# Patient Record
Sex: Female | Born: 1965
Health system: Southern US, Community
[De-identification: ages and names within clinical notes are randomized; demographics above are authoritative.]

## PROBLEM LIST (undated history)

## (undated) DIAGNOSIS — C9210 Chronic myeloid leukemia, BCR/ABL-positive, not having achieved remission: Secondary | ICD-10-CM

## (undated) DIAGNOSIS — E7800 Pure hypercholesterolemia, unspecified: Secondary | ICD-10-CM

## (undated) HISTORY — PX: ABDOMINAL SURGERY: SHX537

## (undated) HISTORY — PX: LAPAROSCOPY: SHX197

---

## 2019-10-02 ENCOUNTER — Ambulatory Visit (HOSPITAL_COMMUNITY)
Admission: EM | Admit: 2019-10-02 | Discharge: 2019-10-02 | Disposition: A | Discharge status: Home / Self Care | Payer: HRSA Program | Attending: Family Medicine | Admitting: Family Medicine

## 2019-10-02 ENCOUNTER — Other Ambulatory Visit: Payer: Self-pay

## 2019-10-02 ENCOUNTER — Encounter (HOSPITAL_COMMUNITY): Payer: Self-pay | Admitting: Emergency Medicine

## 2019-10-02 DIAGNOSIS — B349 Viral infection, unspecified: Secondary | ICD-10-CM

## 2019-10-02 DIAGNOSIS — R197 Diarrhea, unspecified: Secondary | ICD-10-CM | POA: Diagnosis not present

## 2019-10-02 DIAGNOSIS — R110 Nausea: Secondary | ICD-10-CM | POA: Insufficient documentation

## 2019-10-02 DIAGNOSIS — E785 Hyperlipidemia, unspecified: Secondary | ICD-10-CM | POA: Insufficient documentation

## 2019-10-02 DIAGNOSIS — Z79899 Other long term (current) drug therapy: Secondary | ICD-10-CM | POA: Insufficient documentation

## 2019-10-02 DIAGNOSIS — Z881 Allergy status to other antibiotic agents status: Secondary | ICD-10-CM | POA: Diagnosis not present

## 2019-10-02 DIAGNOSIS — Z885 Allergy status to narcotic agent status: Secondary | ICD-10-CM | POA: Insufficient documentation

## 2019-10-02 DIAGNOSIS — Z20822 Contact with and (suspected) exposure to covid-19: Secondary | ICD-10-CM | POA: Insufficient documentation

## 2019-10-02 DIAGNOSIS — Z880 Allergy status to penicillin: Secondary | ICD-10-CM | POA: Diagnosis not present

## 2019-10-02 DIAGNOSIS — E7800 Pure hypercholesterolemia, unspecified: Secondary | ICD-10-CM | POA: Diagnosis not present

## 2019-10-02 HISTORY — DX: Pure hypercholesterolemia, unspecified: E78.00

## 2019-10-02 HISTORY — DX: Chronic myeloid leukemia, BCR/ABL-positive, not having achieved remission: C92.10

## 2019-10-02 LAB — POCT URINALYSIS DIP (DEVICE)
Glucose, UA: NEGATIVE mg/dL
Ketones, ur: NEGATIVE mg/dL
Leukocytes,Ua: NEGATIVE
Nitrite: NEGATIVE
Protein, ur: NEGATIVE mg/dL
Specific Gravity, Urine: 1.02 (ref 1.005–1.030)
Urobilinogen, UA: 0.2 mg/dL (ref 0.0–1.0)
pH: 5.5 (ref 5.0–8.0)

## 2019-10-02 LAB — POC SARS CORONAVIRUS 2 AG -  ED: SARS Coronavirus 2 Ag: NEGATIVE

## 2019-10-02 LAB — POC SARS CORONAVIRUS 2 AG: SARS Coronavirus 2 Ag: NEGATIVE

## 2019-10-02 MED ORDER — DULOXETINE HCL 30 MG PO CPEP: 30.0000 mg | ORAL_CAPSULE | Freq: Every day | ORAL | 0 refills | Status: AC

## 2019-10-02 MED ORDER — ACETAMINOPHEN 325 MG PO TABS
975.0000 mg | ORAL_TABLET | Freq: Once | ORAL | Status: AC
  Administered 2019-10-02: 975 mg via ORAL

## 2019-10-02 MED ORDER — ONDANSETRON 4 MG PO TBDP: 4.0000 mg | ORAL_TABLET | Freq: Three times a day (TID) | ORAL | 0 refills | Status: AC | PRN

## 2019-10-02 MED ORDER — ACETAMINOPHEN 325 MG PO TABS
ORAL_TABLET | ORAL | Status: AC
  Filled 2019-10-02: qty 3

## 2019-10-02 MED ORDER — ONDANSETRON 4 MG PO TBDP
ORAL_TABLET | ORAL | Status: AC
  Filled 2019-10-02: qty 1

## 2019-10-02 MED ORDER — ONDANSETRON 4 MG PO TBDP
4.0000 mg | ORAL_TABLET | Freq: Once | ORAL | Status: AC
  Administered 2019-10-02: 4 mg via ORAL

## 2019-10-02 NOTE — Discharge Instructions (Signed)
Rapid negative, PCR pending, Monitor MyChart for results  For nausea: Zofran prescribed. Begin with every 8 hours, than as you are able to hold food down, take it as needed. Start with clear liquids, then move to plain foods like bananas, rice, applesauce, toast, broth, grits, oatmeal. As those food settle okay you may transition to your normal foods. Avoid spicy and greasy foods as much as possible.  For Diarrhea: This is your body's natural way of getting rid of a virus. You may try taking imodium to decrease amount of stools a day, but we do not want you to stop your diarrhea.   Preventing dehydration is key! You need to replace the fluid your body is expelling. Drink plenty of fluids, may use Pedialyte or sports drinks.   Please return if you are experiencing blood in your vomit or stool or experiencing dizziness, lightheadedness, extreme fatigue, increased abdominal pain.

## 2019-10-02 NOTE — ED Triage Notes (Signed)
Pt states she traveled on a place here yesterday from [place], states this morning she started having severe diarrhea, nausea, headaches, body aches, leg pain, fever.

## 2019-10-02 NOTE — ED Provider Notes (Signed)
[facility]    CSN: [id number] Arrival date & time: 10/02/19  1908      History   Chief Complaint Chief Complaint  Patient presents with  . Diarrhea  . Generalized Body Aches  . Fever    HPI Nancy Jimenez is a 54 y.o. female history of CML, hyperlipidemia, prior Nissen fundal plication, presenting today for evaluation of fevers, diarrhea.  Patient states that this morning she began to have significant watery diarrhea.  Notes that she is gone to the bathroom approximately 20 times today.  She had some abdominal discomfort earlier today, but no persistent abdominal pain, reports generalized cramping which comes and goes with bowels.  She has had associated nausea, but denies vomiting.  Is tolerating oral intake and has been trying to sip on ginger ale.  She has associated headaches, body aches and lower back as well as bilateral thighs.  Fevers noted today as well.  Denies any URI symptoms of congestion, rhinorrhea or cough.  This evening has started to have a slight irritation in her throat.  She did recently travel from [place] and moved to [place] approximately 3 weeks ago.  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  She denies any known Covid exposure.  She notes that her last blood work was within normal limits and she is not currently on any active treatment for her CML.  She attempted to use some Pepto-Bismol earlier today.  Since noted a black tongue.  Has felt slightly weak as well as with a dry mouth.  Reports normal oral intake yesterday.  Reports oncology treatment from [facility] oncology through [place] and [facility].  Attempted to obtain records through care everywhere without success.  HPI  Past Medical History:  Diagnosis Date  . CML (chronic myelocytic leukemia) ([place])   . High cholesterol     There are no problems to display for this patient.   Past Surgical History:  Procedure  Laterality Date  . ABDOMINAL SURGERY      OB History   No obstetric history on file.      Home Medications    Prior to Admission medications   Medication Sig Start Date End Date Taking? Authorizing Provider  atorvastatin (LIPITOR) 10 MG tablet Take 10 mg by mouth daily.   Yes [provider]  buPROPion (WELLBUTRIN SR) 200 MG 12 hr tablet Take 200 mg by mouth 2 (two) times daily.   Yes [provider]  valACYclovir (VALTREX) 500 MG tablet Take 500 mg by mouth 2 (two) times daily.   Yes [provider]  DULoxetine (CYMBALTA) 30 MG capsule Take 1 capsule (30 mg total) by mouth daily. 10/02/19   Satonya Lux C, PA-C  ondansetron (ZOFRAN ODT) 4 MG disintegrating tablet Take 1 tablet (4 mg total) by mouth every 8 (eight) hours as needed for nausea or vomiting. 10/02/19   Daylynn Stumpp, Elesa Hacker, PA-C    Family History Family History  Problem Relation Age of Onset  . Hypertension Mother   . Hyperlipidemia Mother   . Hypertension Father   . Hyperlipidemia Father     Social History Social History   Tobacco Use  . Smoking status: Never Smoker  Substance Use Topics  . Alcohol use: Yes  . Drug use: Never     Allergies   Amoxicillin, Percocet [oxycodone-acetaminophen], and Zithromax [azithromycin]   Review of Systems Review of Systems  Constitutional: Positive for fatigue and  fever. Negative for activity change, appetite change and chills.  HENT: Positive for sore throat. Negative for congestion, ear pain, rhinorrhea, sinus pressure and trouble swallowing.   Eyes: Negative for discharge and redness.  Respiratory: Negative for cough, chest tightness and shortness of breath.   Cardiovascular: Negative for chest pain.  Gastrointestinal: Positive for diarrhea and nausea. Negative for abdominal pain, blood in stool and vomiting.  Musculoskeletal: Negative for myalgias.  Skin: Negative for rash.  Neurological: Positive for weakness, light-headedness and  headaches. Negative for dizziness.     Physical Exam Triage Vital Signs ED Triage Vitals [10/02/19 1938]  Enc Vitals Group     BP 128/84     Pulse Rate (!) 137     Resp 18     Temp 100.1 F (37.8 C)     Temp src      SpO2 98 %     Weight      Height      Head Circumference      Peak Flow      Pain Score 6     Pain Loc      Pain Edu?      Excl. in [place]?    No data found.  Updated Vital Signs BP 128/84   Pulse (!) 118   Temp 100.1 F (37.8 C)   Resp 18   SpO2 98%   Visual Acuity Right Eye Distance:   Left Eye Distance:   Bilateral Distance:    Right Eye Near:   Left Eye Near:    Bilateral Near:     Physical Exam Vitals and nursing note reviewed.  Constitutional:      General: She is not in acute distress.    Appearance: She is well-developed.  HENT:     Head: Normocephalic and atraumatic.     Ears:     Comments: Bilateral ears without tenderness to palpation of external auricle, tragus and mastoid, EAC's without erythema or swelling, TM's with good bony landmarks and cone of light. Non erythematous.     Mouth/Throat:     Comments: Oral mucosa pink and moist, no tonsillar enlargement or exudate. Posterior pharynx patent and nonerythematous, no uvula deviation or swelling. Normal phonation.  Tongue with hyperpigmented appearing papilla centrally and laterally Eyes:     Conjunctiva/sclera: Conjunctivae normal.  Cardiovascular:     Rate and Rhythm: Regular rhythm. Tachycardia present.     Heart sounds: No murmur.  Pulmonary:     Effort: Pulmonary effort is normal. No respiratory distress.     Breath sounds: Normal breath sounds.     Comments: Breathing comfortably at rest, CTABL, no wheezing, rales or other adventitious sounds auscultated Abdominal:     Palpations: Abdomen is soft.     Tenderness: There is abdominal tenderness.     Comments: Soft, nondistended, multiple well-healed laparoscopic surgical scars to abdomen, tenderness to palpation to right  mid abdomen, negative Murphy's, negative McBurney's, mild epigastric tenderness, nontender to left upper and lower quadrant  Musculoskeletal:     Cervical back: Neck supple.  Skin:    General: Skin is warm and dry.  Neurological:     Mental Status: She is alert.      UC Treatments / Results  Labs (all labs ordered are listed, but only abnormal results are displayed) Labs Reviewed  POCT URINALYSIS DIP (DEVICE) - Abnormal; Notable for the following components:      Result Value   Bilirubin Urine SMALL (*)    Hgb urine  dipstick TRACE (*)    All other components within normal limits  NOVEL CORONAVIRUS, NAA (HOSP ORDER, SEND-OUT TO REF LAB; TAT 18-24 HRS)  POC SARS CORONAVIRUS 2 AG -  ED  POC SARS CORONAVIRUS 2 AG    EKG   Radiology No results found.  Procedures Procedures (including critical care time)  Medications Ordered in UC Medications  acetaminophen (TYLENOL) tablet 975 mg (975 mg Oral Given 10/02/19 2018)  ondansetron (ZOFRAN-ODT) disintegrating tablet 4 mg (4 mg Oral Given 10/02/19 2018)    Initial Impression / Assessment and Plan / UC Course  I have reviewed the triage vital signs and the nursing notes.  Pertinent labs & imaging results that were available during my care of the patient were reviewed by me and considered in my medical decision making (see chart for details).     Patient with fever and tachycardia, 1 day of GI symptoms, abdominal exam negative for peritoneal signs, do not suspect abdominal emergency at this time, but discussed with patient to continue to monitor discomfort in abdomen and if symptoms progressing or worsening to follow-up in emergency room.  Covid point-of-care negative, PCR pending.  Tolerating oral intake at this time, heart rate improved with treatment of fever to 118 after 20 minutes.  Will recommend Imodium to slow stools as well as focus on oral rehydration with Pedialyte and Gatorade, slowly transition diet.  Tylenol and ibuprofen  for fevers.  Given history of CML, recommending patient to follow-up if symptoms progressing or worsening within the next 24 to 48 hours as may need blood work and fluids if failing oral rehydration.  Discussed strict return precautions. Patient verbalized understanding and is agreeable with plan.  Final Clinical Impressions(s) / UC Diagnoses   Final diagnoses:  Diarrhea, unspecified type  Viral illness     Discharge Instructions     Rapid negative, PCR pending, Monitor MyChart for results  For nausea: Zofran prescribed. Begin with every 8 hours, than as you are able to hold food down, take it as needed. Start with clear liquids, then move to plain foods like bananas, rice, applesauce, toast, broth, grits, oatmeal. As those food settle okay you may transition to your normal foods. Avoid spicy and greasy foods as much as possible.  For Diarrhea: This is your body's natural way of getting rid of a virus. You may try taking imodium to decrease amount of stools a day, but we do not want you to stop your diarrhea.   Preventing dehydration is key! You need to replace the fluid your body is expelling. Drink plenty of fluids, may use Pedialyte or sports drinks.   Please return if you are experiencing blood in your vomit or stool or experiencing dizziness, lightheadedness, extreme fatigue, increased abdominal pain.    ED Prescriptions    Medication Sig Dispense Auth. Provider   DULoxetine (CYMBALTA) 30 MG capsule Take 1 capsule (30 mg total) by mouth daily. 60 capsule Irvin Lizama C, PA-C   ondansetron (ZOFRAN ODT) 4 MG disintegrating tablet Take 1 tablet (4 mg total) by mouth every 8 (eight) hours as needed for nausea or vomiting. 20 tablet Tawnya Pujol, [place] C, PA-C     PDMP not reviewed this encounter.   Janith Lima, [place] 10/02/19 2052

## 2019-10-04 LAB — NOVEL CORONAVIRUS, NAA (HOSP ORDER, SEND-OUT TO REF LAB; TAT 18-24 HRS): SARS-CoV-2, NAA: NOT DETECTED

## 2019-10-16 ENCOUNTER — Ambulatory Visit: Payer: Self-pay | Attending: Internal Medicine

## 2019-10-16 DIAGNOSIS — Z23 Encounter for immunization: Secondary | ICD-10-CM | POA: Insufficient documentation

## 2019-10-16 NOTE — Progress Notes (Signed)
   Covid-19 Vaccination Clinic  Name:  Nancy Jimenez    MRN: [id number] DOB: April 24, 1966  10/16/2019  [name] was observed post Covid-19 immunization for 15 minutes without incidence. She was provided with Vaccine Information Sheet and instruction to access the V-Safe system.   [name] was instructed to call 911 with any severe reactions post vaccine: Marland Kitchen Difficulty breathing  . Swelling of your face and throat  . A fast heartbeat  . A bad rash all over your body  . Dizziness and weakness    Immunizations Administered    Name Date Dose VIS Date Route   Pfizer COVID-19 Vaccine 10/16/2019  6:39 PM 0.3 mL 08/21/2019 Intramuscular   Manufacturer: [place]   Lot: [id number]   [place]: [id number]

## 2019-11-10 ENCOUNTER — Ambulatory Visit: Payer: Self-pay | Attending: Internal Medicine

## 2019-11-10 ENCOUNTER — Ambulatory Visit: Payer: Self-pay

## 2019-11-10 DIAGNOSIS — Z23 Encounter for immunization: Secondary | ICD-10-CM | POA: Insufficient documentation

## 2019-11-10 NOTE — Progress Notes (Signed)
   Covid-19 Vaccination Clinic  Name:  Nancy Jimenez    MRN: [id number] DOB: September 29, 1965  11/10/2019  [name] was observed post Covid-19 immunization for 15 minutes without incident. She was provided with Vaccine Information Sheet and instruction to access the V-Safe system.   [name] was instructed to call 911 with any severe reactions post vaccine: Marland Kitchen Difficulty breathing  . Swelling of face and throat  . A fast heartbeat  . A bad rash all over body  . Dizziness and weakness   Immunizations Administered    Name Date Dose VIS Date Route   Pfizer COVID-19 Vaccine 11/10/2019 12:28 PM 0.3 mL 08/21/2019 Intramuscular   Manufacturer: [place]   Lot: [id number]   [place]: [id number]

## 2019-12-10 MED FILL — BUPROPION HCL SR 200 MG TAB: 200 | 90 days supply | Qty: 90 | Fill #0

## 2019-12-10 MED FILL — ATORVASTATIN 10 MG TABLET: 10 | 90 days supply | Qty: 90 | Fill #0

## 2019-12-10 MED FILL — VALACYCLOVIR HCL 500 MG TAB: 500 | 90 days supply | Qty: 180 | Fill #0

## 2019-12-11 MED FILL — DULOXETINE HCL 60 MG CPEP: 60 | 90 days supply | Qty: 90 | Fill #0

## 2020-01-26 DIAGNOSIS — C9210 Chronic myeloid leukemia, BCR/ABL-positive, not having achieved remission: Secondary | ICD-10-CM | POA: Diagnosis not present

## 2020-01-28 DIAGNOSIS — Z01411 Encounter for gynecological examination (general) (routine) with abnormal findings: Secondary | ICD-10-CM | POA: Diagnosis not present

## 2020-01-28 DIAGNOSIS — Z6835 Body mass index (BMI) 35.0-35.9, adult: Secondary | ICD-10-CM | POA: Diagnosis not present

## 2020-01-28 DIAGNOSIS — Z1231 Encounter for screening mammogram for malignant neoplasm of breast: Secondary | ICD-10-CM | POA: Diagnosis not present

## 2020-01-28 DIAGNOSIS — N952 Postmenopausal atrophic vaginitis: Secondary | ICD-10-CM | POA: Diagnosis not present

## 2020-01-28 DIAGNOSIS — Z1239 Encounter for other screening for malignant neoplasm of breast: Secondary | ICD-10-CM | POA: Diagnosis not present

## 2020-01-28 DIAGNOSIS — R3915 Urgency of urination: Secondary | ICD-10-CM | POA: Diagnosis not present

## 2020-01-28 DIAGNOSIS — Z124 Encounter for screening for malignant neoplasm of cervix: Secondary | ICD-10-CM | POA: Diagnosis not present

## 2020-01-28 MED FILL — ESTRADIOL 10 MCG TABS: 10 | 18 days supply | Qty: 18 | Fill #0

## 2020-02-05 ENCOUNTER — Other Ambulatory Visit (HOSPITAL_COMMUNITY): Payer: Self-pay | Admitting: Student

## 2020-02-05 DIAGNOSIS — C9210 Chronic myeloid leukemia, BCR/ABL-positive, not having achieved remission: Secondary | ICD-10-CM | POA: Diagnosis not present

## 2020-02-05 MED FILL — ONDANSETRON ODT 8 MG TABLET: 8 | 10 days supply | Qty: 30 | Fill #0

## 2020-02-10 DIAGNOSIS — C9210 Chronic myeloid leukemia, BCR/ABL-positive, not having achieved remission: Secondary | ICD-10-CM | POA: Diagnosis not present

## 2020-02-25 MED FILL — SPRYCEL 20 MG TABLET: 20 | 30 days supply | Qty: 30 | Fill #0

## 2020-02-26 MED FILL — ONDANSETRON ODT 8 MG TABLET: 8 | 10 days supply | Qty: 30 | Fill #0

## 2020-03-09 DIAGNOSIS — C9210 Chronic myeloid leukemia, BCR/ABL-positive, not having achieved remission: Secondary | ICD-10-CM | POA: Diagnosis not present

## 2020-03-10 MED FILL — DULOXETINE HCL 60 MG CPEP: 60 | 90 days supply | Qty: 90 | Fill #1

## 2020-03-11 DIAGNOSIS — C9210 Chronic myeloid leukemia, BCR/ABL-positive, not having achieved remission: Secondary | ICD-10-CM | POA: Diagnosis not present

## 2020-03-15 DIAGNOSIS — C9210 Chronic myeloid leukemia, BCR/ABL-positive, not having achieved remission: Secondary | ICD-10-CM | POA: Diagnosis not present

## 2020-03-21 MED FILL — SPRYCEL 20 MG TABLET: 20 | 30 days supply | Qty: 30 | Fill #1

## 2020-03-25 DIAGNOSIS — M545 Low back pain: Secondary | ICD-10-CM | POA: Diagnosis not present

## 2020-03-25 DIAGNOSIS — Z79899 Other long term (current) drug therapy: Secondary | ICD-10-CM | POA: Diagnosis not present

## 2020-03-25 DIAGNOSIS — C9210 Chronic myeloid leukemia, BCR/ABL-positive, not having achieved remission: Secondary | ICD-10-CM | POA: Diagnosis not present

## 2020-03-25 DIAGNOSIS — Z9889 Other specified postprocedural states: Secondary | ICD-10-CM | POA: Diagnosis not present

## 2020-03-25 DIAGNOSIS — M5441 Lumbago with sciatica, right side: Secondary | ICD-10-CM | POA: Diagnosis not present

## 2020-03-25 DIAGNOSIS — Z9221 Personal history of antineoplastic chemotherapy: Secondary | ICD-10-CM | POA: Diagnosis not present

## 2020-03-25 DIAGNOSIS — G43909 Migraine, unspecified, not intractable, without status migrainosus: Secondary | ICD-10-CM | POA: Diagnosis not present

## 2020-03-29 DIAGNOSIS — C9210 Chronic myeloid leukemia, BCR/ABL-positive, not having achieved remission: Secondary | ICD-10-CM | POA: Diagnosis not present

## 2020-04-15 MED FILL — BUPROPION HCL SR 200 MG TAB: 200 | 90 days supply | Qty: 90 | Fill #1

## 2020-04-19 MED FILL — SPRYCEL 20 MG TABLET: 20 | 30 days supply | Qty: 30 | Fill #2

## 2020-05-04 DIAGNOSIS — C9210 Chronic myeloid leukemia, BCR/ABL-positive, not having achieved remission: Secondary | ICD-10-CM | POA: Diagnosis not present

## 2020-05-08 ENCOUNTER — Other Ambulatory Visit: Payer: Self-pay

## 2020-05-08 ENCOUNTER — Emergency Department
Admission: EM | Admit: 2020-05-08 | Discharge: 2020-05-08 | Disposition: A | Discharge status: Home / Self Care | Payer: Self-pay | Source: Home / Self Care | Attending: Emergency Medicine | Admitting: Emergency Medicine

## 2020-05-08 DIAGNOSIS — W57XXXA Bitten or stung by nonvenomous insect and other nonvenomous arthropods, initial encounter: Secondary | ICD-10-CM

## 2020-05-08 DIAGNOSIS — L089 Local infection of the skin and subcutaneous tissue, unspecified: Secondary | ICD-10-CM

## 2020-05-08 DIAGNOSIS — S80861A Insect bite (nonvenomous), right lower leg, initial encounter: Secondary | ICD-10-CM

## 2020-05-08 MED ORDER — PREDNISONE 10 MG (21) PO TBPK: ORAL_TABLET | ORAL | 0 refills | Status: DC

## 2020-05-08 MED ORDER — DOXYCYCLINE HYCLATE 100 MG PO CAPS: 100.0000 mg | ORAL_CAPSULE | Freq: Two times a day (BID) | ORAL | 0 refills | Status: DC

## 2020-05-08 NOTE — ED Provider Notes (Signed)
[name]    CSN: [id number] Arrival date & time: 05/08/20  8341 New patient, presents to urgent care on Sunday, 05/08/2020     History   Chief Complaint No chief complaint on file. Chief complaint is worsening bee sting right leg, occurred 2 days ago.  HPI Nancy Jimenez is a 54 y.o. female.   HPI Stung by bee 2 days ago on right leg.  Denies having any anaphylactic or shortness of breath or systemic symptoms right after it occurred. Over the past 2 days, site of bee sting right leg is redder, more swollen, more painful and pruritic.  No drainage or bleeding. Denies fever or chills or chest pain or shortness of breath or facial swelling or dysphagia or nausea or vomiting or abdominal pain.  Pertinent items noted in HPI and remainder of comprehensive ROS otherwise negative.  Past Medical History:  Diagnosis Date  . CML (chronic myelocytic leukemia) ([place])   . High cholesterol     There are no problems to display for this patient.   Past Surgical History:  Procedure Laterality Date  . ABDOMINAL SURGERY    . LAPAROSCOPY      OB History   No obstetric history on file.      Home Medications    Prior to Admission medications   Medication Sig Start Date End Date Taking? Authorizing Provider  atorvastatin (LIPITOR) 10 MG tablet Take 10 mg by mouth daily.   Yes [provider]  buPROPion (WELLBUTRIN SR) 200 MG 12 hr tablet Take 200 mg by mouth 2 (two) times daily.   Yes [provider]  dasatinib (SPRYCEL) 100 MG tablet [place] 100 mg tablet   Yes [provider]  DULoxetine (CYMBALTA) 30 MG capsule Take 1 capsule (30 mg total) by mouth daily. 10/02/19  Yes Wieters, Hallie C, PA-C  fluticasone (FLONASE) 50 MCG/ACT nasal spray fluticasone propionate 50 mcg/actuation nasal spray,suspension   Yes [provider]  ondansetron (ZOFRAN ODT) 4 MG disintegrating tablet Take 1 tablet (4 mg total) by mouth every 8 (eight) hours as  needed for nausea or vomiting. 10/02/19  Yes Wieters, Hallie C, PA-C  valACYclovir (VALTREX) 500 MG tablet Take 500 mg by mouth 2 (two) times daily.   Yes [provider]  doxycycline (VIBRAMYCIN) 100 MG capsule Take 1 capsule (100 mg total) by mouth 2 (two) times daily. 05/08/20   Jacqulyn Cane, MD  predniSONE (STERAPRED UNI-PAK 21 TAB) 10 MG (21) TBPK tablet Take tapering dosage over 6 days as directed 05/08/20   Jacqulyn Cane, MD    Family History Family History  Problem Relation Age of Onset  . Hypertension Mother   . Hyperlipidemia Mother   . Hypertension Father   . Hyperlipidemia Father     Social History Social History   Tobacco Use  . Smoking status: Never Smoker  . Smokeless tobacco: Never Used  Substance Use Topics  . Alcohol use: Yes    Comment: occ  . Drug use: Never     Allergies   Bee pollen, Codeine, Other, Amoxicillin, Percocet [oxycodone-acetaminophen], and Zithromax [azithromycin]   Review of Systems Review of Systems   Physical Exam Triage Vital Signs ED Triage Vitals  Enc Vitals Group     BP 05/08/20 0950 128/82     Pulse Rate 05/08/20 0950 94     Resp --      Temp 05/08/20 0950 99.2 F (37.3 C)     Temp Source 05/08/20 0950 Oral  SpO2 05/08/20 0950 98 %     Weight 05/08/20 0945 191 lb (86.6 kg)     Height 05/08/20 0945 5\' 2"  (1.575 m)     Head Circumference --      Peak Flow --      Pain Score 05/08/20 0945 6     Pain Loc --      Pain Edu? --      Excl. in [place]? --    No data found.  Updated Vital Signs BP 128/82 (BP Location: Right Arm)   Pulse 94   Temp 99.2 F (37.3 C) (Oral)   Ht 5\' 2"  (1.575 m)   Wt 86.6 kg   SpO2 98%   BMI 34.93 kg/m   Physical Exam Vitals reviewed.  Constitutional:      General: She is not in acute distress.  Uncomfortable from right leg bee sting, but no acute cardiorespiratory distress.  She can ambulate on her own.    Appearance: She is well-developed.  HENT:     Head: Normocephalic and  atraumatic.  No facial or lip swelling. Oropharynx: Clear.  No redness or edema.  Airway intact. Eyes:     General: No scleral icterus.    Pupils: Pupils are equal, round, and reactive to light.  Neck: Supple, nontender. Cardiovascular:     Rate and Rhythm: Normal rate and regular rhythm.  No murmur. Pulmonary:     Effort: Pulmonary effort is normal.  Lungs clear to auscultation bilaterally.  No wheezes rales or rhonchi. Abdominal:     General: There is no distension.  Musculoskeletal:   Right lower extremity: No hip tenderness or deformity. Right knee without any bony tenderness or deformity.  Full range of motion. Red swollen indurated warm, tender 3 x 3 cm area right lateral leg, near, but does not extend into the popliteal area.-No fluctuance or red streaks.  Neurovascular distally intact.  No bony tenderness. No signs of DVT.  No cords or calf edema or tenderness. Skin:    General: Skin is warm and dry.  Other than the above, there is no other rash.  No generalized rash. Neurological:     Mental Status: She is alert and oriented to person, place, and time.     Cranial Nerves: No cranial nerve deficit.  Psychiatric:        Behavior: Behavior normal.     UC Treatments / Results  Labs (all labs ordered are listed, but only abnormal results are displayed) Labs Reviewed - No data to display  EKG   Radiology No results found.  Procedures Procedures (including critical care time)  Medications Ordered in UC Medications - No data to display  Initial Impression / Assessment and Plan / UC Course  I have reviewed the triage vital signs and the nursing notes.  Pertinent labs & imaging results that were available during my care of the patient were reviewed by me and considered in my medical decision making (see chart for details).      Final Clinical Impressions(s) / UC Diagnoses   Final diagnoses:  Infected insect bite of right leg, initial encounter  Local allergic  reaction at the site of the bee sting but no generalized allergic reaction or systemic symptoms.  See detailed discharge instructions below. Advised to keep an EpiPen at home on hand in case she gets any generalized allergic reaction from bee sting in the future.   Discharge Instructions     You likely have skin soft tissue infection right  leg from your bee sting 2 days ago.  It is also a local allergic reaction, but not allergic reaction throughout your whole body. Of sent prescriptions to your drugstore for doxycycline, a strong antibiotic for infection, and prednisone Dosepak which should help the itch swelling and pain. May take Benadryl, 1 at bedtime for itch. Tylenol or ibuprofen if needed for pain. Try cool or cold compresses to help with the pain.  Keep right leg elevated. Seek medical care immediately if any severely worsening symptoms.   She voiced understanding and agreement with plans. ED Prescriptions    Medication Sig Dispense Auth. Provider   doxycycline (VIBRAMYCIN) 100 MG capsule Take 1 capsule (100 mg total) by mouth 2 (two) times daily. 20 capsule Jacqulyn Cane, MD   predniSONE (STERAPRED UNI-PAK 21 TAB) 10 MG (21) TBPK tablet Take tapering dosage over 6 days as directed 21 tablet Jacqulyn Cane, MD     PDMP not reviewed this encounter.   Jacqulyn Cane, MD 05/09/20 [phone]

## 2020-05-08 NOTE — ED Triage Notes (Signed)
Pt states that she was stung by a bee  x2 days ago on her right leg. Pt states that the leg is starting to swell and it is turn red. Pt states that she is fully vaccinated.

## 2020-05-08 NOTE — Discharge Instructions (Addendum)
You likely have skin soft tissue infection right leg from your bee sting 2 days ago.  It is also a local allergic reaction, but not allergic reaction throughout your whole body. Of sent prescriptions to your drugstore for doxycycline, a strong antibiotic for infection, and prednisone Dosepak which should help the itch swelling and pain. May take Benadryl, 1 at bedtime for itch. Tylenol or ibuprofen if needed for pain. Try cool or cold compresses to help with the pain.  Keep right leg elevated. Seek medical care immediately if any severely worsening symptoms.

## 2020-05-09 ENCOUNTER — Ambulatory Visit: Payer: Self-pay

## 2020-05-09 DIAGNOSIS — C9210 Chronic myeloid leukemia, BCR/ABL-positive, not having achieved remission: Secondary | ICD-10-CM | POA: Diagnosis not present

## 2020-05-17 DIAGNOSIS — R3915 Urgency of urination: Secondary | ICD-10-CM | POA: Diagnosis not present

## 2020-05-17 DIAGNOSIS — B373 Candidiasis of vulva and vagina: Secondary | ICD-10-CM | POA: Diagnosis not present

## 2020-05-17 DIAGNOSIS — N952 Postmenopausal atrophic vaginitis: Secondary | ICD-10-CM | POA: Diagnosis not present

## 2020-05-25 MED FILL — SPRYCEL 20 MG TABLET: 20 | 30 days supply | Qty: 30 | Fill #3

## 2020-06-13 DIAGNOSIS — G4730 Sleep apnea, unspecified: Secondary | ICD-10-CM | POA: Diagnosis not present

## 2020-06-13 DIAGNOSIS — Z23 Encounter for immunization: Secondary | ICD-10-CM | POA: Diagnosis not present

## 2020-06-13 DIAGNOSIS — E7800 Pure hypercholesterolemia, unspecified: Secondary | ICD-10-CM | POA: Diagnosis not present

## 2020-06-13 DIAGNOSIS — F330 Major depressive disorder, recurrent, mild: Secondary | ICD-10-CM | POA: Diagnosis not present

## 2020-06-13 DIAGNOSIS — G43009 Migraine without aura, not intractable, without status migrainosus: Secondary | ICD-10-CM | POA: Diagnosis not present

## 2020-06-13 DIAGNOSIS — C9210 Chronic myeloid leukemia, BCR/ABL-positive, not having achieved remission: Secondary | ICD-10-CM | POA: Diagnosis not present

## 2020-06-16 ENCOUNTER — Other Ambulatory Visit: Payer: 59

## 2020-06-22 ENCOUNTER — Other Ambulatory Visit (HOSPITAL_COMMUNITY): Payer: Self-pay

## 2020-06-22 MED FILL — ATORVASTATIN CALCIUM 10 MG: 10 | 90 days supply | Qty: 90 | Fill #1

## 2020-06-22 MED FILL — VALACYCLOVIR HCL 500 MG TAB: 500 | 90 days supply | Qty: 90 | Fill #0

## 2020-06-22 MED FILL — DULoxetine HCL 30 MG CPEP: 30 | 90 days supply | Qty: 90 | Fill #0

## 2020-06-23 MED FILL — DULOXETINE HCL 60 MG CPEP: 60 | 30 days supply | Qty: 30 | Fill #0

## 2020-06-26 MED FILL — BUPROPION HCL SR 200 MG TAB: 200 | 90 days supply | Qty: 90 | Fill #0

## 2020-06-27 ENCOUNTER — Encounter (HOSPITAL_BASED_OUTPATIENT_CLINIC_OR_DEPARTMENT_OTHER): Payer: Self-pay

## 2020-06-27 DIAGNOSIS — R0683 Snoring: Secondary | ICD-10-CM

## 2020-06-27 DIAGNOSIS — G4710 Hypersomnia, unspecified: Secondary | ICD-10-CM

## 2020-06-27 DIAGNOSIS — G4733 Obstructive sleep apnea (adult) (pediatric): Secondary | ICD-10-CM

## 2020-06-27 MED FILL — SPRYCEL 20 MG TABLET: 20 | 30 days supply | Qty: 30 | Fill #4

## 2020-07-05 DIAGNOSIS — C9210 Chronic myeloid leukemia, BCR/ABL-positive, not having achieved remission: Secondary | ICD-10-CM | POA: Diagnosis not present

## 2020-07-05 DIAGNOSIS — Z79899 Other long term (current) drug therapy: Secondary | ICD-10-CM | POA: Diagnosis not present

## 2020-07-05 DIAGNOSIS — M545 Low back pain, unspecified: Secondary | ICD-10-CM | POA: Diagnosis not present

## 2020-07-05 DIAGNOSIS — G43909 Migraine, unspecified, not intractable, without status migrainosus: Secondary | ICD-10-CM | POA: Diagnosis not present

## 2020-07-06 DIAGNOSIS — C9210 Chronic myeloid leukemia, BCR/ABL-positive, not having achieved remission: Secondary | ICD-10-CM | POA: Diagnosis not present

## 2020-07-09 ENCOUNTER — Ambulatory Visit: Payer: 59

## 2020-07-23 ENCOUNTER — Ambulatory Visit: Payer: 59 | Attending: Internal Medicine

## 2020-07-23 DIAGNOSIS — Z23 Encounter for immunization: Secondary | ICD-10-CM

## 2020-07-23 NOTE — Progress Notes (Signed)
   Covid-19 Vaccination Clinic  Name:  Nancy Jimenez    MRN: [id number] DOB: [birth year]  07/23/2020  [name] was observed post Covid-19 immunization for 15 minutes without incident. She was provided with Vaccine Information Sheet and instruction to access the V-Safe system.   [name] was instructed to call 911 with any severe reactions post vaccine: Marland Kitchen Difficulty breathing  . Swelling of face and throat  . A fast heartbeat  . A bad rash all over body  . Dizziness and weakness   Immunizations Administered    Name Date Dose VIS Date Route   Pfizer COVID-19 Vaccine 07/23/2020  1:17 PM 0.3 mL 06/29/2020 Intramuscular   Manufacturer: [place]   Lot: [id number]   [place]: [id number]

## 2020-07-25 MED FILL — SPRYCEL 20 MG TABLET: 20 | 30 days supply | Qty: 30 | Fill #5

## 2020-07-27 ENCOUNTER — Ambulatory Visit (HOSPITAL_BASED_OUTPATIENT_CLINIC_OR_DEPARTMENT_OTHER): Payer: 59 | Attending: Internal Medicine | Admitting: Internal Medicine

## 2020-07-27 ENCOUNTER — Other Ambulatory Visit: Payer: Self-pay

## 2020-07-27 DIAGNOSIS — G4710 Hypersomnia, unspecified: Secondary | ICD-10-CM

## 2020-07-27 DIAGNOSIS — R0683 Snoring: Secondary | ICD-10-CM

## 2020-07-27 DIAGNOSIS — R400 Somnolence: Secondary | ICD-10-CM | POA: Diagnosis present

## 2020-07-27 DIAGNOSIS — G4733 Obstructive sleep apnea (adult) (pediatric): Secondary | ICD-10-CM | POA: Insufficient documentation

## 2020-08-01 ENCOUNTER — Ambulatory Visit: Payer: 59

## 2020-08-02 DIAGNOSIS — G4733 Obstructive sleep apnea (adult) (pediatric): Secondary | ICD-10-CM | POA: Diagnosis not present

## 2020-08-02 NOTE — Procedures (Signed)
    NAME: Nancy Jimenez DATE OF BIRTH:  May 11, 1966 MEDICAL RECORD NUMBER [id number]  LOCATION: [facility]  PHYSICIAN: Marius Ditch  DATE OF STUDY: 07/27/2020  SLEEP STUDY TYPE: Out of Center Sleep Test                REFERRING PHYSICIAN: Marius Ditch, MD  INDICATION FOR STUDY: prior h/o OSA; old records not available. Test done to re-establish diagnosis and obtain machine; excessive daytime sleepiness; witnessed apnea   EPWORTH SLEEPINESS SCORE:  NA HEIGHT: 5\' 2"  (157.5 cm)  WEIGHT: 192 lb (87.1 kg)    Body mass index is 35.12 kg/m.  NECK SIZE: 15 in.  Conclusions: 1. Mild to moderate obstructive sleep apnea (Respiratory Event Index (REI) 15/hr) - please note that REI approximates AHI but is not identical as a HST measures time of use and not time of sleep. 2. Severe desaturations (min O2 sat: 71%; time below 90%: 25 minutes [5% of monitored time])   Recommendations: 1. The patient should be treated with weight loss (if appropriate), an oral appliance, or CPAP. If CPAP is chosen, this could be started by auto-CPAP or by CPAP titration in the lab.  2. Please note that a HST may underestimate the degree of obstructive sleep apnea due to the lack of EEG data. Therefore, respiratory effort related arousals (RERAs) will not be measured. If a prominent part of the patient's sleep disordered breathing are RERAs, then the HST will understate the severity of the patient's obstructive sleep apnea.   This study is a Type III Home Sleep Apnea Test measuring respiratory effort, airflow, heart rate, and oxygen saturation.   Marius Ditch Sleep specialist, [place] [employer]  ELECTRONICALLY SIGNED ON:  08/02/2020, 8:27 AM [place] PH: [phone]   FX: [phone] [place]

## 2020-08-25 ENCOUNTER — Other Ambulatory Visit (HOSPITAL_COMMUNITY): Payer: Self-pay | Admitting: Hematology & Oncology

## 2020-08-25 MED FILL — SPRYCEL 20 MG TABLET: 20 | 30 days supply | Qty: 30 | Fill #0

## 2020-09-21 MED FILL — SPRYCEL 20 MG TABLET: 20 | 30 days supply | Qty: 30 | Fill #1

## 2020-10-04 DIAGNOSIS — G4700 Insomnia, unspecified: Secondary | ICD-10-CM | POA: Diagnosis not present

## 2020-10-04 DIAGNOSIS — R197 Diarrhea, unspecified: Secondary | ICD-10-CM | POA: Diagnosis not present

## 2020-10-04 DIAGNOSIS — G4730 Sleep apnea, unspecified: Secondary | ICD-10-CM | POA: Diagnosis not present

## 2020-10-04 DIAGNOSIS — Z79899 Other long term (current) drug therapy: Secondary | ICD-10-CM | POA: Diagnosis not present

## 2020-10-04 DIAGNOSIS — C9210 Chronic myeloid leukemia, BCR/ABL-positive, not having achieved remission: Secondary | ICD-10-CM | POA: Diagnosis not present

## 2020-10-04 DIAGNOSIS — G43909 Migraine, unspecified, not intractable, without status migrainosus: Secondary | ICD-10-CM | POA: Diagnosis not present

## 2020-10-06 ENCOUNTER — Other Ambulatory Visit (HOSPITAL_COMMUNITY): Payer: Self-pay | Admitting: Internal Medicine

## 2020-10-06 DIAGNOSIS — C9210 Chronic myeloid leukemia, BCR/ABL-positive, not having achieved remission: Secondary | ICD-10-CM | POA: Diagnosis not present

## 2020-10-06 DIAGNOSIS — E663 Overweight: Secondary | ICD-10-CM | POA: Diagnosis not present

## 2020-10-06 DIAGNOSIS — Z0000 Encounter for general adult medical examination without abnormal findings: Secondary | ICD-10-CM | POA: Diagnosis not present

## 2020-10-06 DIAGNOSIS — F330 Major depressive disorder, recurrent, mild: Secondary | ICD-10-CM | POA: Diagnosis not present

## 2020-10-06 DIAGNOSIS — E7800 Pure hypercholesterolemia, unspecified: Secondary | ICD-10-CM | POA: Diagnosis not present

## 2020-10-06 DIAGNOSIS — G4733 Obstructive sleep apnea (adult) (pediatric): Secondary | ICD-10-CM | POA: Diagnosis not present

## 2020-10-06 MED FILL — VALACYCLOVIR HCL 500 MG TAB: 500 | 90 days supply | Qty: 90 | Fill #0

## 2020-10-06 MED FILL — BUPROPION HCL SR 200 MG TAB: 200 | 90 days supply | Qty: 90 | Fill #0

## 2020-10-06 MED FILL — ATORVASTATIN CALCIUM 10 MG: 10 | 90 days supply | Qty: 90 | Fill #0

## 2020-10-06 MED FILL — DULoxetine HCL 30 MG CPEP: 30 | 90 days supply | Qty: 90 | Fill #0

## 2020-10-07 DIAGNOSIS — C9210 Chronic myeloid leukemia, BCR/ABL-positive, not having achieved remission: Secondary | ICD-10-CM | POA: Diagnosis not present

## 2020-11-03 MED FILL — SPRYCEL 20 MG TABLET: 20 | 30 days supply | Qty: 30 | Fill #2

## 2020-11-25 ENCOUNTER — Other Ambulatory Visit (HOSPITAL_COMMUNITY): Payer: Self-pay | Admitting: Dermatology

## 2020-11-25 DIAGNOSIS — L815 Leukoderma, not elsewhere classified: Secondary | ICD-10-CM | POA: Diagnosis not present

## 2020-11-25 DIAGNOSIS — L821 Other seborrheic keratosis: Secondary | ICD-10-CM | POA: Diagnosis not present

## 2020-11-25 DIAGNOSIS — L309 Dermatitis, unspecified: Secondary | ICD-10-CM | POA: Diagnosis not present

## 2020-11-25 DIAGNOSIS — R6889 Other general symptoms and signs: Secondary | ICD-10-CM | POA: Diagnosis not present

## 2020-11-25 DIAGNOSIS — L304 Erythema intertrigo: Secondary | ICD-10-CM | POA: Diagnosis not present

## 2020-11-30 MED FILL — SPRYCEL 20 MG TABLET: 20 | 30 days supply | Qty: 30 | Fill #3

## 2020-12-06 ENCOUNTER — Other Ambulatory Visit (HOSPITAL_COMMUNITY): Payer: Self-pay

## 2020-12-14 ENCOUNTER — Other Ambulatory Visit (HOSPITAL_COMMUNITY): Payer: Self-pay

## 2020-12-14 MED FILL — Desonide Foam 0.05%: CUTANEOUS | 30 days supply | Qty: 100 | Fill #0 | Status: CN

## 2020-12-16 ENCOUNTER — Other Ambulatory Visit (HOSPITAL_COMMUNITY): Payer: Self-pay

## 2020-12-19 ENCOUNTER — Other Ambulatory Visit (HOSPITAL_COMMUNITY): Payer: Self-pay

## 2020-12-19 MED ORDER — DESONIDE 0.05 % EX GEL
CUTANEOUS | 3 refills | Status: AC
  Filled 2020-12-19: qty 60, 14d supply, fill #0
  Filled 2020-12-22 – 2020-12-26 (×2): qty 60, 30d supply, fill #0

## 2020-12-22 ENCOUNTER — Other Ambulatory Visit (HOSPITAL_COMMUNITY): Payer: Self-pay

## 2020-12-24 ENCOUNTER — Other Ambulatory Visit (HOSPITAL_COMMUNITY): Payer: Self-pay

## 2020-12-24 MED ORDER — DESONIDE 0.05 % EX OINT
TOPICAL_OINTMENT | CUTANEOUS | 2 refills | Status: AC
  Filled 2020-12-24: qty 60, 20d supply, fill #0
  Filled 2020-12-26: qty 60, 30d supply, fill #0

## 2020-12-26 ENCOUNTER — Other Ambulatory Visit (HOSPITAL_COMMUNITY): Payer: Self-pay

## 2020-12-26 MED FILL — Dasatinib Tab 20 MG: ORAL | 30 days supply | Qty: 30 | Fill #0 | Status: AC

## 2020-12-28 ENCOUNTER — Other Ambulatory Visit (HOSPITAL_COMMUNITY): Payer: Self-pay

## 2020-12-29 ENCOUNTER — Other Ambulatory Visit (HOSPITAL_COMMUNITY): Payer: Self-pay

## 2020-12-30 ENCOUNTER — Other Ambulatory Visit (HOSPITAL_COMMUNITY): Payer: Self-pay

## 2020-12-30 MED ORDER — HYDROCORTISONE 2.5 % EX CREA
TOPICAL_CREAM | CUTANEOUS | 3 refills | Status: AC
  Filled 2020-12-30: qty 28, 14d supply, fill #0

## 2021-01-02 ENCOUNTER — Other Ambulatory Visit (HOSPITAL_COMMUNITY): Payer: Self-pay

## 2021-01-02 MED FILL — Atorvastatin Calcium Tab 10 MG (Base Equivalent): ORAL | 90 days supply | Qty: 90 | Fill #0 | Status: AC

## 2021-01-03 DIAGNOSIS — K521 Toxic gastroenteritis and colitis: Secondary | ICD-10-CM | POA: Diagnosis not present

## 2021-01-03 DIAGNOSIS — G43909 Migraine, unspecified, not intractable, without status migrainosus: Secondary | ICD-10-CM | POA: Diagnosis not present

## 2021-01-03 DIAGNOSIS — R5383 Other fatigue: Secondary | ICD-10-CM | POA: Diagnosis not present

## 2021-01-03 DIAGNOSIS — C9210 Chronic myeloid leukemia, BCR/ABL-positive, not having achieved remission: Secondary | ICD-10-CM | POA: Diagnosis not present

## 2021-01-05 DIAGNOSIS — C9210 Chronic myeloid leukemia, BCR/ABL-positive, not having achieved remission: Secondary | ICD-10-CM | POA: Diagnosis not present

## 2021-01-16 ENCOUNTER — Other Ambulatory Visit: Payer: Self-pay

## 2021-01-16 ENCOUNTER — Emergency Department (HOSPITAL_BASED_OUTPATIENT_CLINIC_OR_DEPARTMENT_OTHER): Payer: 59

## 2021-01-16 ENCOUNTER — Emergency Department (HOSPITAL_BASED_OUTPATIENT_CLINIC_OR_DEPARTMENT_OTHER)
Admission: EM | Admit: 2021-01-16 | Discharge: 2021-01-16 | Disposition: A | Discharge status: Home / Self Care | Payer: 59 | Attending: Emergency Medicine | Admitting: Emergency Medicine

## 2021-01-16 ENCOUNTER — Encounter (HOSPITAL_BASED_OUTPATIENT_CLINIC_OR_DEPARTMENT_OTHER): Payer: Self-pay | Admitting: Emergency Medicine

## 2021-01-16 DIAGNOSIS — U071 COVID-19: Secondary | ICD-10-CM | POA: Diagnosis not present

## 2021-01-16 DIAGNOSIS — R0602 Shortness of breath: Secondary | ICD-10-CM | POA: Diagnosis present

## 2021-01-16 DIAGNOSIS — R059 Cough, unspecified: Secondary | ICD-10-CM | POA: Diagnosis not present

## 2021-01-16 DIAGNOSIS — R519 Headache, unspecified: Secondary | ICD-10-CM | POA: Diagnosis not present

## 2021-01-16 MED ORDER — BENZONATATE 100 MG PO CAPS: 100.0000 mg | ORAL_CAPSULE | Freq: Three times a day (TID) | ORAL | 0 refills | Status: DC | PRN

## 2021-01-16 NOTE — ED Provider Notes (Signed)
[facility] EMERGENCY DEPT Provider Note   CSN: [id number] Arrival date & time: 01/16/21  1752     History Chief Complaint  Patient presents with  . Shortness of Breath    Nancy Jimenez is a 55 y.o. female.  The history is provided by the patient and medical records.  Shortness of Breath  Nancy Jimenez is a 55 y.o. female who presents to the Emergency Department complaining of sob. She has a history of CML and takes sprycel 20 mg daily. On May 3 she started developing headache, sinus pressure and drainage with dry cough. Then later she developed body aches and low-grade fever. Yesterday her fever went up to 101.5. She also developed worsening cough and she now has chest pain with coughing as well sore throat. She finally tested positive for COVID-19 on a home test yesterday. Her son has been positive for several days.   Has been vaccinated and boosted for COVID 19.      Past Medical History:  Diagnosis Date  . CML (chronic myelocytic leukemia) ([place])   . High cholesterol     There are no problems to display for this patient.   Past Surgical History:  Procedure Laterality Date  . ABDOMINAL SURGERY    . LAPAROSCOPY       OB History   No obstetric history on file.     Family History  Problem Relation Age of Onset  . Hypertension Mother   . Hyperlipidemia Mother   . Hypertension Father   . Hyperlipidemia Father     Social History   Tobacco Use  . Smoking status: Never Smoker  . Smokeless tobacco: Never Used  Substance Use Topics  . Alcohol use: Yes    Comment: occ  . Drug use: Never    Home Medications Prior to Admission medications   Medication Sig Start Date End Date Taking? Authorizing Provider  benzonatate (TESSALON) 100 MG capsule Take 1 capsule (100 mg total) by mouth 3 (three) times daily as needed for cough. 01/16/21  Yes Quintella Reichert, MD  atorvastatin (LIPITOR) 10 MG tablet Take 10 mg by mouth daily.    [provider]   atorvastatin (LIPITOR) 10 MG tablet TAKE 1 TABLET BY MOUTH ONCE A DAY 10/06/20 10/06/21  Seward Carol, MD  buPROPion [facility] SR) 200 MG 12 hr tablet Take 200 mg by mouth 2 (two) times daily.    [provider]  buPROPion (WELLBUTRIN SR) 200 MG 12 hr tablet TAKE 1 TABLET BY MOUTH ONCE DAILY 10/06/20 10/06/21  Seward Carol, MD  buPROPion (WELLBUTRIN SR) 200 MG 12 hr tablet TAKE 1 TABLET BY MOUTH ONCE A DAY 06/22/20 06/22/21  Seward Carol, MD  dasatinib (SPRYCEL) 100 MG tablet Sprycel 100 mg tablet    [provider]  dasatinib (SPRYCEL) 20 MG tablet TAKE 1 TABLET (20 MG TOTAL) BY MOUTH DAILY. 08/25/20 08/25/21  Toy Care, PA-C  dasatinib (SPRYCEL) 20 MG tablet TAKE 1 TABLET (20 MG TOTAL) BY MOUTH DAILY. 02/05/20 02/04/21  Jacqualine Mau, MD  desonide (DESONATE) 0.05 % gel Apply to face and neck 1-2 times daily as needed for itching and irritation. 12/19/20     desonide (DESOWEN) 0.05 % ointment Apply topically 2 times daily. Apply to face and neck 1 to 2 times daily as needed for itching and irritation. 12/24/20     desonide (VERDESO) 0.05 % foam APPLT TO FACE AND NECK 1-2 TIMES A DAY AS NEEDED FOR ITCHY, IRRITATED SKIN 11/25/20 11/25/21  Kimberlee Nearing  L, MD  doxycycline (VIBRAMYCIN) 100 MG capsule Take 1 capsule (100 mg total) by mouth 2 (two) times daily. 05/08/20   Jacqulyn Cane, MD  DULoxetine (CYMBALTA) 30 MG capsule Take 1 capsule (30 mg total) by mouth daily. 10/02/19   Wieters, Hallie C, PA-C  DULoxetine (CYMBALTA) 30 MG capsule TAKE 1 CAPSULE BY MOUTH ONCE A DAY 10/06/20 10/06/21  Seward Carol, MD  DULoxetine (CYMBALTA) 60 MG capsule TAKE 1 CAPSULE BY MOUTH ONCE DAILY 06/22/20 06/22/21    fluticasone (FLONASE) 50 MCG/ACT nasal spray fluticasone propionate 50 mcg/actuation nasal spray,suspension    [provider]  hydrocortisone 2.5 % cream Apply to affected areas on face 1-2 times daily as needed 12/30/20     ondansetron (ZOFRAN ODT) 4 MG disintegrating  tablet Take 1 tablet (4 mg total) by mouth every 8 (eight) hours as needed for nausea or vomiting. 10/02/19   Wieters, Hallie C, PA-C  predniSONE (STERAPRED UNI-PAK 21 TAB) 10 MG (21) TBPK tablet Take tapering dosage over 6 days as directed 05/08/20   Jacqulyn Cane, MD  valACYclovir (VALTREX) 500 MG tablet Take 500 mg by mouth 2 (two) times daily.    [provider]  valACYclovir (VALTREX) 500 MG tablet TAKE 1 TABLET BY MOUTH ONCE A DAY 10/06/20 10/06/21  Seward Carol, MD  valACYclovir (VALTREX) 500 MG tablet TAKE 1 TABLET BY MOUTH ONCE A DAY 06/22/20 06/22/21  Seward Carol, MD    Allergies    Bee pollen, Codeine, Other, Amoxicillin, Percocet [oxycodone-acetaminophen], and Zithromax [azithromycin]  Review of Systems   Review of Systems  Respiratory: Positive for shortness of breath.   All other systems reviewed and are negative.   Physical Exam Updated Vital Signs BP (!) 152/83 (BP Location: Right Arm)   Pulse 100   Temp 98.6 F (37 C) (Oral)   Resp 18   Ht 5\' 2"  (1.575 m)   Wt 87.1 kg   SpO2 96%   BMI 35.12 kg/m   Physical Exam Vitals and nursing note reviewed.  Constitutional:      Appearance: She is well-developed.  HENT:     Head: Normocephalic and atraumatic.     Mouth/Throat:     Comments: Mild erythema in posterior OP Cardiovascular:     Rate and Rhythm: Normal rate and regular rhythm.     Heart sounds: No murmur heard.   Pulmonary:     Effort: Pulmonary effort is normal. No respiratory distress.     Breath sounds: Normal breath sounds.  Abdominal:     Palpations: Abdomen is soft.     Tenderness: There is no abdominal tenderness. There is no guarding or rebound.  Musculoskeletal:        General: No tenderness.  Skin:    General: Skin is warm and dry.  Neurological:     Mental Status: She is alert and oriented to person, place, and time.  Psychiatric:        Behavior: Behavior normal.     ED Results / Procedures / Treatments   Labs (all labs  ordered are listed, but only abnormal results are displayed) Labs Reviewed - No data to display  EKG None  Radiology DG Chest Portable 1 View  Result Date: 01/16/2021 CLINICAL DATA:  Cough, congestion and headache. EXAM: PORTABLE CHEST 1 VIEW COMPARISON:  None. FINDINGS: The heart size and mediastinal contours are within normal limits. Both lungs are clear. The visualized skeletal structures are unremarkable. IMPRESSION: No active disease. Electronically Signed   By: Hoover Browns  [place] M.D.   On: 01/16/2021 19:17    Procedures Procedures   Medications Ordered in ED Medications - No data to display  ED Course  I have reviewed the triage vital signs and the nursing notes.  Pertinent labs & imaging results that were available during my care of the patient were reviewed by me and considered in my medical decision making (see chart for details).    MDM Rules/Calculators/A&P                         patient with COVID-19 infection here for evaluation of worsening cough, sore throat and body aches. She has no respiratory distress on evaluation. She is greater than five days into her symptoms and unfortunately is not a candidate for the antiviral or monoclonal antibody infusions at this time. Given her vaccination and booster she is low risk for progression to serious illness. Discussed with patient home care for COVID-19 infection with symptomatic care, outpatient follow-up and return precautions.  Nancy Jimenez was evaluated in Emergency Department on 01/16/2021 for the symptoms described in the history of present illness. She was evaluated in the context of the global COVID-19 pandemic, which necessitated consideration that the patient might be at risk for infection with the SARS-CoV-2 virus that causes COVID-19. Institutional protocols and algorithms that pertain to the evaluation of patients at risk for COVID-19 are in a state of rapid change based on information released by regulatory bodies  including the CDC and federal and state organizations. These policies and algorithms were followed during the patient's care in the ED.   Final Clinical Impression(s) / ED Diagnoses Final diagnoses:  COVID-19 virus infection    Rx / DC Orders ED Discharge Orders         Ordered    benzonatate (TESSALON) 100 MG capsule  3 times daily PRN        01/16/21 2152           Quintella Reichert, MD 01/16/21 2331

## 2021-01-16 NOTE — ED Triage Notes (Signed)
Shortness of breath today , covid + x 1 day, sore throat , chest pain when coughing, on chemo pill for leukemia.

## 2021-01-16 NOTE — ED Notes (Signed)
Per pt had a postive COVID test at home yesterday, low grade fever, states productive cough, states sore throat and HA, states all symptoms started Tuesday.

## 2021-01-25 ENCOUNTER — Ambulatory Visit: Payer: 59

## 2021-01-25 ENCOUNTER — Other Ambulatory Visit (HOSPITAL_COMMUNITY): Payer: Self-pay

## 2021-01-25 MED FILL — Duloxetine HCl Enteric Coated Pellets Cap 30 MG (Base Eq): ORAL | 90 days supply | Qty: 90 | Fill #0 | Status: AC

## 2021-01-25 MED FILL — Bupropion HCl Tab ER 12HR 200 MG: ORAL | 90 days supply | Qty: 90 | Fill #0 | Status: AC

## 2021-01-26 ENCOUNTER — Other Ambulatory Visit (HOSPITAL_COMMUNITY): Payer: Self-pay

## 2021-01-30 ENCOUNTER — Other Ambulatory Visit (HOSPITAL_COMMUNITY): Payer: Self-pay

## 2021-01-30 MED FILL — Dasatinib Tab 20 MG: ORAL | 30 days supply | Qty: 30 | Fill #1 | Status: AC

## 2021-02-09 ENCOUNTER — Other Ambulatory Visit (HOSPITAL_BASED_OUTPATIENT_CLINIC_OR_DEPARTMENT_OTHER): Payer: Self-pay

## 2021-02-09 ENCOUNTER — Other Ambulatory Visit: Payer: Self-pay

## 2021-02-09 ENCOUNTER — Ambulatory Visit: Payer: 59 | Attending: Internal Medicine

## 2021-02-09 DIAGNOSIS — Z23 Encounter for immunization: Secondary | ICD-10-CM

## 2021-02-09 MED ORDER — PFIZER-BIONT COVID-19 VAC-TRIS 30 MCG/0.3ML IM SUSP
INTRAMUSCULAR | 0 refills | Status: DC
  Filled 2021-02-09: qty 0.3, 1d supply, fill #0

## 2021-02-09 NOTE — Progress Notes (Signed)
   Covid-19 Vaccination Clinic  Name:  Nancy Jimenez    MRN: [id number] DOB: 1966-08-15  02/09/2021  [name] was observed post Covid-19 immunization for 15 minutes without incident. She was provided with Vaccine Information Sheet and instruction to access the V-Safe system.   [name] was instructed to call 911 with any severe reactions post vaccine: Marland Kitchen Difficulty breathing  . Swelling of face and throat  . A fast heartbeat  . A bad rash all over body  . Dizziness and weakness   Immunizations Administered    Name Date Dose VIS Date Route   PFIZER Comrnaty(Gray TOP) Covid-19 Vaccine 02/09/2021  3:12 PM 0.3 mL 08/18/2020 Intramuscular   Manufacturer: [place]   Lot: [id number]   [place]: [phone]

## 2021-02-22 ENCOUNTER — Other Ambulatory Visit (HOSPITAL_COMMUNITY): Payer: Self-pay

## 2021-02-22 MED ORDER — SPRYCEL 20 MG PO TABS
20.0000 mg | ORAL_TABLET | Freq: Every day | ORAL | 11 refills | Status: DC
  Filled 2021-02-22: qty 30, 30d supply, fill #0
  Filled 2021-03-23 – 2021-04-06 (×2): qty 30, 30d supply, fill #1
  Filled 2021-04-26 – 2021-05-02 (×4): qty 30, 30d supply, fill #2
  Filled 2021-06-05: qty 30, 30d supply, fill #3
  Filled 2021-07-03: qty 30, 30d supply, fill #4
  Filled 2021-08-01: qty 30, 30d supply, fill #5
  Filled 2021-08-28: qty 30, 30d supply, fill #6
  Filled 2021-10-02: qty 30, 30d supply, fill #7
  Filled 2021-10-25: qty 30, 30d supply, fill #8
  Filled 2021-12-05: qty 30, 30d supply, fill #9
  Filled 2022-01-01: qty 30, 30d supply, fill #10
  Filled 2022-01-31: qty 30, 30d supply, fill #11

## 2021-02-27 ENCOUNTER — Other Ambulatory Visit (HOSPITAL_COMMUNITY): Payer: Self-pay

## 2021-03-07 DIAGNOSIS — G4733 Obstructive sleep apnea (adult) (pediatric): Secondary | ICD-10-CM | POA: Diagnosis not present

## 2021-03-22 ENCOUNTER — Other Ambulatory Visit (HOSPITAL_COMMUNITY): Payer: Self-pay

## 2021-03-23 ENCOUNTER — Other Ambulatory Visit (HOSPITAL_COMMUNITY): Payer: Self-pay

## 2021-03-31 DIAGNOSIS — C9210 Chronic myeloid leukemia, BCR/ABL-positive, not having achieved remission: Secondary | ICD-10-CM | POA: Diagnosis not present

## 2021-03-31 DIAGNOSIS — Z8616 Personal history of COVID-19: Secondary | ICD-10-CM | POA: Diagnosis not present

## 2021-03-31 DIAGNOSIS — G43909 Migraine, unspecified, not intractable, without status migrainosus: Secondary | ICD-10-CM | POA: Diagnosis not present

## 2021-03-31 DIAGNOSIS — G4733 Obstructive sleep apnea (adult) (pediatric): Secondary | ICD-10-CM | POA: Diagnosis not present

## 2021-04-03 DIAGNOSIS — C9210 Chronic myeloid leukemia, BCR/ABL-positive, not having achieved remission: Secondary | ICD-10-CM | POA: Diagnosis not present

## 2021-04-04 ENCOUNTER — Other Ambulatory Visit (HOSPITAL_COMMUNITY): Payer: Self-pay

## 2021-04-06 ENCOUNTER — Other Ambulatory Visit (HOSPITAL_COMMUNITY): Payer: Self-pay

## 2021-04-06 DIAGNOSIS — G4733 Obstructive sleep apnea (adult) (pediatric): Secondary | ICD-10-CM | POA: Diagnosis not present

## 2021-04-06 MED FILL — Atorvastatin Calcium Tab 10 MG (Base Equivalent): ORAL | 90 days supply | Qty: 90 | Fill #1 | Status: AC

## 2021-04-26 ENCOUNTER — Other Ambulatory Visit (HOSPITAL_COMMUNITY): Payer: Self-pay

## 2021-04-27 ENCOUNTER — Other Ambulatory Visit (HOSPITAL_COMMUNITY): Payer: Self-pay

## 2021-05-01 ENCOUNTER — Other Ambulatory Visit (HOSPITAL_COMMUNITY): Payer: Self-pay

## 2021-05-02 ENCOUNTER — Other Ambulatory Visit (HOSPITAL_COMMUNITY): Payer: Self-pay

## 2021-05-03 ENCOUNTER — Other Ambulatory Visit (HOSPITAL_COMMUNITY): Payer: Self-pay

## 2021-05-03 MED FILL — Duloxetine HCl Enteric Coated Pellets Cap 30 MG (Base Eq): ORAL | 90 days supply | Qty: 90 | Fill #1 | Status: AC

## 2021-05-03 MED FILL — Bupropion HCl Tab ER 12HR 200 MG: ORAL | 90 days supply | Qty: 90 | Fill #1 | Status: AC

## 2021-05-07 ENCOUNTER — Emergency Department
Admission: EM | Admit: 2021-05-07 | Discharge: 2021-05-07 | Disposition: A | Discharge status: Home / Self Care | Payer: 59 | Source: Home / Self Care

## 2021-05-07 ENCOUNTER — Other Ambulatory Visit: Payer: Self-pay

## 2021-05-07 ENCOUNTER — Encounter: Payer: Self-pay | Admitting: Emergency Medicine

## 2021-05-07 DIAGNOSIS — J0100 Acute maxillary sinusitis, unspecified: Secondary | ICD-10-CM

## 2021-05-07 DIAGNOSIS — Z9189 Other specified personal risk factors, not elsewhere classified: Secondary | ICD-10-CM

## 2021-05-07 DIAGNOSIS — J3489 Other specified disorders of nose and nasal sinuses: Secondary | ICD-10-CM

## 2021-05-07 DIAGNOSIS — Z7689 Persons encountering health services in other specified circumstances: Secondary | ICD-10-CM | POA: Diagnosis not present

## 2021-05-07 DIAGNOSIS — J309 Allergic rhinitis, unspecified: Secondary | ICD-10-CM

## 2021-05-07 DIAGNOSIS — G4733 Obstructive sleep apnea (adult) (pediatric): Secondary | ICD-10-CM | POA: Diagnosis not present

## 2021-05-07 MED ORDER — SULFAMETHOXAZOLE-TRIMETHOPRIM 800-160 MG PO TABS: 1.0000 | ORAL_TABLET | Freq: Two times a day (BID) | ORAL | 0 refills | Status: AC

## 2021-05-07 MED ORDER — FEXOFENADINE HCL 180 MG PO TABS: 180.0000 mg | ORAL_TABLET | Freq: Every day | ORAL | 0 refills | Status: AC

## 2021-05-07 MED ORDER — METHYLPREDNISOLONE ACETATE 80 MG/ML IJ SUSP
80.0000 mg | Freq: Once | INTRAMUSCULAR | Status: AC
  Administered 2021-05-07: 80 mg via INTRAMUSCULAR

## 2021-05-07 NOTE — Discharge Instructions (Addendum)
Advised/instructed patient to take medication as directed with food to completion advised patient to take Allegra daily with the first dose of Augmentin for the next 7 days, then as needed.  Encouraged patient to increase daily water intake while taking these medications.

## 2021-05-07 NOTE — ED Triage Notes (Signed)
Cough,  congestion, sinus pressure  since Thursday OTC - dayquil & nyquil  COVID vaccine + 2 boosters COVID 5/22 Denies fever

## 2021-05-07 NOTE — ED Provider Notes (Signed)
[facility]    CSN: [id number] Arrival date & time: 05/07/21  0949      History   Chief Complaint Chief Complaint  Patient presents with   Nasal Congestion   Cough    HPI Nancy Jimenez is a 54 y.o. female.   HPI 55 year old female presents with sinus pressure, nasal congestion, and cough for 3 days.  Patient reports is vaccinated for COVID-19 +2 boosters.  Reports negative home COVID-19 test 2 days ago and had COVID-19 in May of this year.  Patient denies fever, currently 99.7.  PMH significant for CML.  Past Medical History:  Diagnosis Date   CML (chronic myelocytic leukemia) ([place])    High cholesterol     There are no problems to display for this patient.   Past Surgical History:  Procedure Laterality Date   ABDOMINAL SURGERY     LAPAROSCOPY      OB History   No obstetric history on file.      Home Medications    Prior to Admission medications   Medication Sig Start Date End Date Taking? Authorizing Provider  fexofenadine (ALLEGRA ALLERGY) 180 MG tablet Take 1 tablet (180 mg total) by mouth daily for 15 days. 05/07/21 05/22/21 Yes Eliezer Lofts, FNP  sulfamethoxazole-trimethoprim (BACTRIM DS) 800-160 MG tablet Take 1 tablet by mouth 2 (two) times daily for 7 days. 05/07/21 05/14/21 Yes Eliezer Lofts, FNP  atorvastatin (LIPITOR) 10 MG tablet Take 10 mg by mouth daily. Patient not taking: Reported on 05/07/2021    [provider]  atorvastatin (LIPITOR) 10 MG tablet TAKE 1 TABLET BY MOUTH ONCE A DAY 10/06/20 10/06/21  Seward Carol, MD  benzonatate (TESSALON) 100 MG capsule Take 1 capsule (100 mg total) by mouth 3 (three) times daily as needed for cough. Patient not taking: Reported on 05/07/2021 01/16/21   Quintella Reichert, MD  buPROPion [facility] SR) 200 MG 12 hr tablet Take 200 mg by mouth 2 (two) times daily. Patient not taking: Reported on 05/07/2021    [provider]  buPROPion [facility] SR) 200 MG 12 hr tablet TAKE 1 TABLET BY  MOUTH ONCE DAILY 10/06/20 10/06/21  Seward Carol, MD  buPROPion (WELLBUTRIN SR) 200 MG 12 hr tablet TAKE 1 TABLET BY MOUTH ONCE A DAY Patient not taking: Reported on 05/07/2021 06/22/20 06/22/21  Seward Carol, MD  COVID-19 mRNA Vac-TriS, Pfizer, (PFIZER-BIONT COVID-19 VAC-TRIS) SUSP injection Inject into the muscle. 02/09/21   Carlyle Basques, MD  dasatinib (SPRYCEL) 100 MG tablet Sprycel 100 mg tablet Patient not taking: Reported on 05/07/2021    [provider]  dasatinib (SPRYCEL) 20 MG tablet Take 1 tablet (20 mg total) by mouth daily. 02/22/21     desonide (DESONATE) 0.05 % gel Apply to face and neck 1-2 times daily as needed for itching and irritation. 12/19/20     desonide (DESOWEN) 0.05 % ointment Apply topically 2 times daily. Apply to face and neck 1 to 2 times daily as needed for itching and irritation. 12/24/20     desonide (VERDESO) 0.05 % foam APPLT TO FACE AND NECK 1-2 TIMES A DAY AS NEEDED FOR ITCHY, IRRITATED SKIN Patient not taking: Reported on 05/07/2021 11/25/20 11/25/21  Terrall Laity, MD  doxycycline (VIBRAMYCIN) 100 MG capsule Take 1 capsule (100 mg total) by mouth 2 (two) times daily. Patient not taking: Reported on 05/07/2021 05/08/20   Jacqulyn Cane, MD  DULoxetine (CYMBALTA) 30 MG capsule Take 1 capsule (30 mg total) by mouth daily. Patient not taking: Reported  on 05/07/2021 10/02/19   Wieters, Hallie C, PA-C  DULoxetine (CYMBALTA) 30 MG capsule TAKE 1 CAPSULE BY MOUTH ONCE A DAY 10/06/20 10/06/21  Seward Carol, MD  DULoxetine (CYMBALTA) 60 MG capsule TAKE 1 CAPSULE BY MOUTH ONCE DAILY 06/22/20 06/22/21    fluticasone (FLONASE) 50 MCG/ACT nasal spray fluticasone propionate 50 mcg/actuation nasal spray,suspension    [provider]  hydrocortisone 2.5 % cream Apply to affected areas on face 1-2 times daily as needed 12/30/20     ondansetron (ZOFRAN ODT) 4 MG disintegrating tablet Take 1 tablet (4 mg total) by mouth every 8 (eight) hours as needed for nausea or  vomiting. 10/02/19   Wieters, Hallie C, PA-C  predniSONE (STERAPRED UNI-PAK 21 TAB) 10 MG (21) TBPK tablet Take tapering dosage over 6 days as directed Patient not taking: Reported on 05/07/2021 05/08/20   Jacqulyn Cane, MD  valACYclovir (VALTREX) 500 MG tablet Take 500 mg by mouth 2 (two) times daily. Patient not taking: Reported on 05/07/2021    [provider]  valACYclovir (VALTREX) 500 MG tablet TAKE 1 TABLET BY MOUTH ONCE A DAY 10/06/20 10/06/21  Seward Carol, MD  valACYclovir (VALTREX) 500 MG tablet TAKE 1 TABLET BY MOUTH ONCE A DAY Patient not taking: Reported on 05/07/2021 06/22/20 06/22/21  Seward Carol, MD    Family History Family History  Problem Relation Age of Onset   Hypertension Mother    Hyperlipidemia Mother    Hypertension Father    Hyperlipidemia Father     Social History Social History   Tobacco Use   Smoking status: Never   Smokeless tobacco: Never  Substance Use Topics   Alcohol use: Yes    Comment: occ   Drug use: Never     Allergies   Bee pollen, Codeine, Other, Amoxicillin, Percocet [oxycodone-acetaminophen], Zithromax [azithromycin], and Tape   Review of Systems Review of Systems  HENT:  Positive for congestion and sinus pain.   Respiratory:  Positive for cough.   All other systems reviewed and are negative.   Physical Exam Triage Vital Signs ED Triage Vitals  Enc Vitals Group     BP 05/07/21 1020 138/90     Pulse Rate 05/07/21 1020 100     Resp 05/07/21 1020 16     Temp 05/07/21 1020 99.7 F (37.6 C)     Temp Source 05/07/21 1020 Oral     SpO2 05/07/21 1020 99 %     Weight 05/07/21 1021 190 lb (86.2 kg)     Height 05/07/21 1021 '5\' 2"'$  (1.575 m)     Head Circumference --      Peak Flow --      Pain Score 05/07/21 1021 4     Pain Loc --      Pain Edu? --      Excl. in [place]? --    No data found.  Updated Vital Signs BP 138/90 (BP Location: Right Arm)   Pulse 100   Temp 99.7 F (37.6 C) (Oral)   Resp 16   Ht '5\' 2"'$   (1.575 m)   Wt 190 lb (86.2 kg)   SpO2 99%   BMI 34.75 kg/m      Physical Exam Vitals and nursing note reviewed.  Constitutional:      General: She is not in acute distress.    Appearance: Normal appearance. She is normal weight. She is not ill-appearing.  HENT:     Head: Normocephalic and atraumatic.     Right Ear: Tympanic membrane  and external ear normal.     Left Ear: Tympanic membrane and external ear normal.     Ears:     Comments: Eustation tube dysfunction noted bilaterally    Nose:     Right Turbinates: Enlarged.     Left Turbinates: Enlarged.     Right Sinus: Maxillary sinus tenderness present.     Left Sinus: Maxillary sinus tenderness present.     Comments: Turbinates are erythematous    Mouth/Throat:     Lips: Pink.     Mouth: Mucous membranes are moist.     Pharynx: Oropharynx is clear. Uvula midline.     Comments: Moderate amount of clear drainage of posterior oropharynx noted Eyes:     Extraocular Movements: Extraocular movements intact.     Conjunctiva/sclera: Conjunctivae normal.     Pupils: Pupils are equal, round, and reactive to light.  Cardiovascular:     Rate and Rhythm: Normal rate and regular rhythm.     Pulses: Normal pulses.     Heart sounds: Normal heart sounds.  Pulmonary:     Effort: Pulmonary effort is normal.     Breath sounds: Normal breath sounds. No wheezing, rhonchi or rales.     Comments: No adventitious breath sounds noted Musculoskeletal:        General: Normal range of motion.     Cervical back: Normal range of motion and neck supple. No tenderness.  Lymphadenopathy:     Cervical: No cervical adenopathy.  Skin:    General: Skin is warm and dry.  Neurological:     General: No focal deficit present.     Mental Status: She is oriented to person, place, and time. Mental status is at baseline.  Psychiatric:        Mood and Affect: Mood normal.        Behavior: Behavior normal.        Thought Content: Thought content normal.      UC Treatments / Results  Labs (all labs ordered are listed, but only abnormal results are displayed) Labs Reviewed  COVID-19, FLU A+B NAA    EKG   Radiology No results found.  Procedures Procedures (including critical care time)  Medications Ordered in UC Medications  methylPREDNISolone acetate (DEPO-MEDROL) injection 80 mg (80 mg Intramuscular Given 05/07/21 1057)    Initial Impression / Assessment and Plan / UC Course  I have reviewed the triage vital signs and the nursing notes.  Pertinent labs & imaging results that were available during my care of the patient were reviewed by me and considered in my medical decision making (see chart for details).     MDM: 1.  At increased risk of exposure to COVID-19 virus-COVID-19 PCR/flu A&B ordered; 2.  Acute maxillary sinusitis, recurrence not specified-Rx'd Bactrim; 3.  Sinus pressure-I am Depo-Medrol 80 mg given once in clinic today prior to discharge; 4.  Allergic rhinitis-Rx'd Allegra. Advised/instructed patient to take medication as directed with food to completion advised patient to take Allegra daily with the first dose of Augmentin for the next 7 days, then as needed.  Encouraged patient to increase daily water intake while taking these medications.  Patient discharged home, hemodynamically stable. Final Clinical Impressions(s) / UC Diagnoses   Final diagnoses:  At increased risk of exposure to COVID-19 virus  Acute maxillary sinusitis, recurrence not specified  Sinus pressure  Allergic rhinitis, unspecified seasonality, unspecified trigger     Discharge Instructions      Advised/instructed patient to take medication as directed  with food to completion advised patient to take Allegra daily with the first dose of Augmentin for the next 7 days, then as needed.  Encouraged patient to increase daily water intake while taking these medications.     ED Prescriptions     Medication Sig Dispense Auth. Provider    sulfamethoxazole-trimethoprim (BACTRIM DS) 800-160 MG tablet Take 1 tablet by mouth 2 (two) times daily for 7 days. 14 tablet Eliezer Lofts, FNP   fexofenadine [facility] ALLERGY) 180 MG tablet Take 1 tablet (180 mg total) by mouth daily for 15 days. 15 tablet Eliezer Lofts, FNP      PDMP not reviewed this encounter.   Eliezer Lofts, Abanda 05/07/21 1102

## 2021-05-08 LAB — COVID-19, FLU A+B NAA
Influenza A, NAA: NOT DETECTED
Influenza B, NAA: NOT DETECTED
SARS-CoV-2, NAA: NOT DETECTED

## 2021-05-24 ENCOUNTER — Other Ambulatory Visit (HOSPITAL_COMMUNITY): Payer: Self-pay

## 2021-05-30 DIAGNOSIS — G4733 Obstructive sleep apnea (adult) (pediatric): Secondary | ICD-10-CM | POA: Diagnosis not present

## 2021-05-31 ENCOUNTER — Other Ambulatory Visit (HOSPITAL_COMMUNITY): Payer: Self-pay

## 2021-06-05 ENCOUNTER — Other Ambulatory Visit (HOSPITAL_COMMUNITY): Payer: Self-pay

## 2021-06-07 DIAGNOSIS — G4733 Obstructive sleep apnea (adult) (pediatric): Secondary | ICD-10-CM | POA: Diagnosis not present

## 2021-06-08 ENCOUNTER — Other Ambulatory Visit (HOSPITAL_COMMUNITY): Payer: Self-pay

## 2021-07-03 ENCOUNTER — Other Ambulatory Visit (HOSPITAL_COMMUNITY): Payer: Self-pay

## 2021-07-04 DIAGNOSIS — Z79899 Other long term (current) drug therapy: Secondary | ICD-10-CM | POA: Diagnosis not present

## 2021-07-04 DIAGNOSIS — C9210 Chronic myeloid leukemia, BCR/ABL-positive, not having achieved remission: Secondary | ICD-10-CM | POA: Diagnosis not present

## 2021-07-04 DIAGNOSIS — G43909 Migraine, unspecified, not intractable, without status migrainosus: Secondary | ICD-10-CM | POA: Diagnosis not present

## 2021-07-04 DIAGNOSIS — R197 Diarrhea, unspecified: Secondary | ICD-10-CM | POA: Diagnosis not present

## 2021-07-04 DIAGNOSIS — G4700 Insomnia, unspecified: Secondary | ICD-10-CM | POA: Diagnosis not present

## 2021-07-04 DIAGNOSIS — G4733 Obstructive sleep apnea (adult) (pediatric): Secondary | ICD-10-CM | POA: Diagnosis not present

## 2021-07-05 DIAGNOSIS — C9210 Chronic myeloid leukemia, BCR/ABL-positive, not having achieved remission: Secondary | ICD-10-CM | POA: Diagnosis not present

## 2021-07-06 ENCOUNTER — Other Ambulatory Visit (HOSPITAL_COMMUNITY): Payer: Self-pay

## 2021-07-06 MED FILL — Valacyclovir HCl Tab 500 MG: ORAL | 90 days supply | Qty: 90 | Fill #0 | Status: AC

## 2021-07-09 MED FILL — Atorvastatin Calcium Tab 10 MG (Base Equivalent): ORAL | 90 days supply | Qty: 90 | Fill #2 | Status: AC

## 2021-07-10 ENCOUNTER — Other Ambulatory Visit (HOSPITAL_COMMUNITY): Payer: Self-pay

## 2021-08-01 ENCOUNTER — Other Ambulatory Visit (HOSPITAL_COMMUNITY): Payer: Self-pay

## 2021-08-04 ENCOUNTER — Other Ambulatory Visit (HOSPITAL_COMMUNITY): Payer: Self-pay

## 2021-08-05 MED FILL — Bupropion HCl Tab ER 12HR 200 MG: ORAL | 90 days supply | Qty: 90 | Fill #2 | Status: AC

## 2021-08-05 MED FILL — Duloxetine HCl Enteric Coated Pellets Cap 30 MG (Base Eq): ORAL | 90 days supply | Qty: 90 | Fill #2 | Status: AC

## 2021-08-06 ENCOUNTER — Other Ambulatory Visit (HOSPITAL_COMMUNITY): Payer: Self-pay

## 2021-08-07 ENCOUNTER — Other Ambulatory Visit (HOSPITAL_COMMUNITY): Payer: Self-pay

## 2021-08-28 ENCOUNTER — Other Ambulatory Visit (HOSPITAL_COMMUNITY): Payer: Self-pay

## 2021-08-28 DIAGNOSIS — G4733 Obstructive sleep apnea (adult) (pediatric): Secondary | ICD-10-CM | POA: Diagnosis not present

## 2021-09-05 ENCOUNTER — Other Ambulatory Visit (HOSPITAL_COMMUNITY): Payer: Self-pay

## 2021-09-18 DIAGNOSIS — G4733 Obstructive sleep apnea (adult) (pediatric): Secondary | ICD-10-CM | POA: Diagnosis not present

## 2021-09-21 ENCOUNTER — Other Ambulatory Visit (HOSPITAL_COMMUNITY): Payer: Self-pay

## 2021-09-21 MED FILL — Valacyclovir HCl Tab 500 MG: ORAL | 90 days supply | Qty: 90 | Fill #1 | Status: AC

## 2021-09-22 DIAGNOSIS — R197 Diarrhea, unspecified: Secondary | ICD-10-CM | POA: Diagnosis not present

## 2021-09-22 DIAGNOSIS — M5459 Other low back pain: Secondary | ICD-10-CM | POA: Diagnosis not present

## 2021-09-22 DIAGNOSIS — M549 Dorsalgia, unspecified: Secondary | ICD-10-CM | POA: Diagnosis not present

## 2021-09-22 DIAGNOSIS — Z79899 Other long term (current) drug therapy: Secondary | ICD-10-CM | POA: Diagnosis not present

## 2021-09-22 DIAGNOSIS — R5383 Other fatigue: Secondary | ICD-10-CM | POA: Diagnosis not present

## 2021-09-22 DIAGNOSIS — C9210 Chronic myeloid leukemia, BCR/ABL-positive, not having achieved remission: Secondary | ICD-10-CM | POA: Diagnosis not present

## 2021-09-22 DIAGNOSIS — R110 Nausea: Secondary | ICD-10-CM | POA: Diagnosis not present

## 2021-09-22 DIAGNOSIS — G4733 Obstructive sleep apnea (adult) (pediatric): Secondary | ICD-10-CM | POA: Diagnosis not present

## 2021-09-22 DIAGNOSIS — G43909 Migraine, unspecified, not intractable, without status migrainosus: Secondary | ICD-10-CM | POA: Diagnosis not present

## 2021-09-22 DIAGNOSIS — G4700 Insomnia, unspecified: Secondary | ICD-10-CM | POA: Diagnosis not present

## 2021-09-26 DIAGNOSIS — C9210 Chronic myeloid leukemia, BCR/ABL-positive, not having achieved remission: Secondary | ICD-10-CM | POA: Diagnosis not present

## 2021-09-29 ENCOUNTER — Other Ambulatory Visit (HOSPITAL_COMMUNITY): Payer: Self-pay

## 2021-10-02 ENCOUNTER — Other Ambulatory Visit (HOSPITAL_COMMUNITY): Payer: Self-pay

## 2021-10-04 ENCOUNTER — Other Ambulatory Visit (HOSPITAL_COMMUNITY): Payer: Self-pay

## 2021-10-24 ENCOUNTER — Other Ambulatory Visit (HOSPITAL_COMMUNITY): Payer: Self-pay

## 2021-10-25 ENCOUNTER — Other Ambulatory Visit (HOSPITAL_COMMUNITY): Payer: Self-pay

## 2021-10-25 DIAGNOSIS — G4733 Obstructive sleep apnea (adult) (pediatric): Secondary | ICD-10-CM | POA: Diagnosis not present

## 2021-10-25 DIAGNOSIS — E7800 Pure hypercholesterolemia, unspecified: Secondary | ICD-10-CM | POA: Diagnosis not present

## 2021-10-25 DIAGNOSIS — Z8673 Personal history of transient ischemic attack (TIA), and cerebral infarction without residual deficits: Secondary | ICD-10-CM | POA: Diagnosis not present

## 2021-10-25 DIAGNOSIS — F5101 Primary insomnia: Secondary | ICD-10-CM | POA: Diagnosis not present

## 2021-10-25 DIAGNOSIS — Z0000 Encounter for general adult medical examination without abnormal findings: Secondary | ICD-10-CM | POA: Diagnosis not present

## 2021-10-25 DIAGNOSIS — G43009 Migraine without aura, not intractable, without status migrainosus: Secondary | ICD-10-CM | POA: Diagnosis not present

## 2021-10-25 DIAGNOSIS — R0789 Other chest pain: Secondary | ICD-10-CM | POA: Diagnosis not present

## 2021-10-25 DIAGNOSIS — Z23 Encounter for immunization: Secondary | ICD-10-CM | POA: Diagnosis not present

## 2021-10-25 DIAGNOSIS — F330 Major depressive disorder, recurrent, mild: Secondary | ICD-10-CM | POA: Diagnosis not present

## 2021-10-25 DIAGNOSIS — C9210 Chronic myeloid leukemia, BCR/ABL-positive, not having achieved remission: Secondary | ICD-10-CM | POA: Diagnosis not present

## 2021-10-26 ENCOUNTER — Other Ambulatory Visit (HOSPITAL_COMMUNITY): Payer: Self-pay

## 2021-10-26 MED ORDER — BUPROPION HCL ER (SR) 200 MG PO TB12
ORAL_TABLET | ORAL | 0 refills | Status: DC
  Filled 2021-10-26: qty 90, 90d supply, fill #0

## 2021-10-26 MED ORDER — ATORVASTATIN CALCIUM 10 MG PO TABS
ORAL_TABLET | ORAL | 0 refills | Status: DC
  Filled 2021-10-26: qty 90, 90d supply, fill #0

## 2021-10-26 MED ORDER — VALACYCLOVIR HCL 500 MG PO TABS
ORAL_TABLET | ORAL | 0 refills | Status: DC
  Filled 2021-10-26: qty 90, 90d supply, fill #0

## 2021-10-26 MED ORDER — SUMATRIPTAN SUCCINATE 100 MG PO TABS
ORAL_TABLET | ORAL | 0 refills | Status: DC
  Filled 2021-10-26: qty 9, 20d supply, fill #0

## 2021-10-26 MED ORDER — DULOXETINE HCL 30 MG PO CPEP
ORAL_CAPSULE | ORAL | 0 refills | Status: DC
  Filled 2021-10-26: qty 90, 90d supply, fill #0

## 2021-10-27 ENCOUNTER — Other Ambulatory Visit (HOSPITAL_COMMUNITY): Payer: Self-pay

## 2021-10-28 ENCOUNTER — Other Ambulatory Visit (HOSPITAL_COMMUNITY): Payer: Self-pay

## 2021-10-30 ENCOUNTER — Other Ambulatory Visit (HOSPITAL_COMMUNITY): Payer: Self-pay

## 2021-10-30 MED ORDER — SUMATRIPTAN SUCCINATE 100 MG PO TABS
ORAL_TABLET | ORAL | 4 refills | Status: AC
  Filled 2021-10-30: qty 9, 30d supply, fill #0

## 2021-10-30 MED ORDER — VALACYCLOVIR HCL 500 MG PO TABS
500.0000 mg | ORAL_TABLET | Freq: Every day | ORAL | 3 refills | Status: AC
  Filled 2021-10-30 – 2022-01-11 (×2): qty 90, 90d supply, fill #0
  Filled 2022-04-30: qty 90, 90d supply, fill #1

## 2021-10-30 MED ORDER — DULOXETINE HCL 30 MG PO CPEP
30.0000 mg | ORAL_CAPSULE | Freq: Every day | ORAL | 3 refills | Status: AC
  Filled 2021-10-30 – 2022-02-07 (×2): qty 90, 90d supply, fill #0
  Filled 2022-05-08: qty 90, 90d supply, fill #1

## 2021-10-30 MED ORDER — ATORVASTATIN CALCIUM 10 MG PO TABS
10.0000 mg | ORAL_TABLET | Freq: Every day | ORAL | 3 refills | Status: AC
  Filled 2021-10-30 – 2022-02-07 (×2): qty 90, 90d supply, fill #0
  Filled 2022-05-08: qty 90, 90d supply, fill #1

## 2021-10-30 MED ORDER — BUPROPION HCL ER (SR) 200 MG PO TB12
200.0000 mg | ORAL_TABLET | Freq: Every day | ORAL | 3 refills | Status: AC
  Filled 2021-10-30 – 2022-02-07 (×2): qty 90, 90d supply, fill #0
  Filled 2022-05-08: qty 90, 90d supply, fill #1

## 2021-10-31 ENCOUNTER — Other Ambulatory Visit (HOSPITAL_COMMUNITY): Payer: Self-pay

## 2021-11-08 ENCOUNTER — Other Ambulatory Visit (HOSPITAL_COMMUNITY): Payer: Self-pay

## 2021-12-01 ENCOUNTER — Other Ambulatory Visit (HOSPITAL_COMMUNITY): Payer: Self-pay

## 2021-12-05 ENCOUNTER — Other Ambulatory Visit (HOSPITAL_COMMUNITY): Payer: Self-pay

## 2021-12-11 ENCOUNTER — Other Ambulatory Visit (HOSPITAL_COMMUNITY): Payer: Self-pay

## 2022-01-01 ENCOUNTER — Other Ambulatory Visit (HOSPITAL_COMMUNITY): Payer: Self-pay

## 2022-01-08 ENCOUNTER — Other Ambulatory Visit (HOSPITAL_COMMUNITY): Payer: Self-pay

## 2022-01-11 ENCOUNTER — Other Ambulatory Visit (HOSPITAL_COMMUNITY): Payer: Self-pay

## 2022-01-23 DIAGNOSIS — G43909 Migraine, unspecified, not intractable, without status migrainosus: Secondary | ICD-10-CM | POA: Diagnosis not present

## 2022-01-23 DIAGNOSIS — M545 Low back pain, unspecified: Secondary | ICD-10-CM | POA: Diagnosis not present

## 2022-01-23 DIAGNOSIS — R202 Paresthesia of skin: Secondary | ICD-10-CM | POA: Diagnosis not present

## 2022-01-23 DIAGNOSIS — M5459 Other low back pain: Secondary | ICD-10-CM | POA: Diagnosis not present

## 2022-01-23 DIAGNOSIS — C9210 Chronic myeloid leukemia, BCR/ABL-positive, not having achieved remission: Secondary | ICD-10-CM | POA: Diagnosis not present

## 2022-01-23 DIAGNOSIS — G4733 Obstructive sleep apnea (adult) (pediatric): Secondary | ICD-10-CM | POA: Diagnosis not present

## 2022-01-23 DIAGNOSIS — Z79899 Other long term (current) drug therapy: Secondary | ICD-10-CM | POA: Diagnosis not present

## 2022-01-31 ENCOUNTER — Other Ambulatory Visit (HOSPITAL_COMMUNITY): Payer: Self-pay

## 2022-02-07 ENCOUNTER — Other Ambulatory Visit (HOSPITAL_COMMUNITY): Payer: Self-pay

## 2022-02-07 DIAGNOSIS — Z23 Encounter for immunization: Secondary | ICD-10-CM | POA: Diagnosis not present

## 2022-02-08 ENCOUNTER — Other Ambulatory Visit (HOSPITAL_COMMUNITY): Payer: Self-pay

## 2022-02-16 DIAGNOSIS — I498 Other specified cardiac arrhythmias: Secondary | ICD-10-CM | POA: Diagnosis not present

## 2022-02-16 DIAGNOSIS — R0789 Other chest pain: Secondary | ICD-10-CM | POA: Diagnosis not present

## 2022-02-16 DIAGNOSIS — R079 Chest pain, unspecified: Secondary | ICD-10-CM | POA: Diagnosis not present

## 2022-02-16 DIAGNOSIS — R1013 Epigastric pain: Secondary | ICD-10-CM | POA: Diagnosis not present

## 2022-02-16 DIAGNOSIS — Z9884 Bariatric surgery status: Secondary | ICD-10-CM | POA: Diagnosis not present

## 2022-02-20 ENCOUNTER — Other Ambulatory Visit (HOSPITAL_COMMUNITY): Payer: Self-pay

## 2022-02-20 MED ORDER — SUCRALFATE 1 G PO TABS
ORAL_TABLET | ORAL | 0 refills | Status: AC
  Filled 2022-02-20: qty 56, 14d supply, fill #0

## 2022-03-06 ENCOUNTER — Other Ambulatory Visit (HOSPITAL_COMMUNITY): Payer: Self-pay

## 2022-03-07 ENCOUNTER — Other Ambulatory Visit (HOSPITAL_COMMUNITY): Payer: Self-pay

## 2022-03-07 DIAGNOSIS — M79672 Pain in left foot: Secondary | ICD-10-CM | POA: Diagnosis not present

## 2022-03-07 DIAGNOSIS — M79671 Pain in right foot: Secondary | ICD-10-CM | POA: Diagnosis not present

## 2022-03-07 DIAGNOSIS — M5442 Lumbago with sciatica, left side: Secondary | ICD-10-CM | POA: Diagnosis not present

## 2022-03-07 DIAGNOSIS — M5441 Lumbago with sciatica, right side: Secondary | ICD-10-CM | POA: Diagnosis not present

## 2022-03-07 DIAGNOSIS — G8929 Other chronic pain: Secondary | ICD-10-CM | POA: Diagnosis not present

## 2022-03-07 MED ORDER — SPRYCEL 20 MG PO TABS
20.0000 mg | ORAL_TABLET | Freq: Every day | ORAL | 11 refills | Status: AC
  Filled 2022-03-07: qty 30, 30d supply, fill #0
  Filled 2022-04-03 – 2022-04-09 (×2): qty 30, 30d supply, fill #1
  Filled 2022-04-30: qty 30, 30d supply, fill #2
  Filled 2022-07-04: qty 30, 30d supply, fill #3

## 2022-03-16 ENCOUNTER — Other Ambulatory Visit (HOSPITAL_COMMUNITY): Payer: Self-pay

## 2022-03-29 ENCOUNTER — Other Ambulatory Visit (HOSPITAL_COMMUNITY): Payer: Self-pay

## 2022-04-03 ENCOUNTER — Other Ambulatory Visit (HOSPITAL_COMMUNITY): Payer: Self-pay

## 2022-04-06 ENCOUNTER — Ambulatory Visit: Payer: 59 | Admitting: Family

## 2022-04-06 ENCOUNTER — Encounter: Payer: Self-pay | Admitting: Family

## 2022-04-06 ENCOUNTER — Emergency Department: Admit: 2022-04-06 | Discharge status: Absent without leave | Payer: 59

## 2022-04-06 VITALS — BP 129/86 | HR 85 | Temp 98.0°F | Ht 62.0 in | Wt 189.0 lb

## 2022-04-06 DIAGNOSIS — R519 Headache, unspecified: Secondary | ICD-10-CM | POA: Diagnosis not present

## 2022-04-06 MED ORDER — METHYLPREDNISOLONE ACETATE 80 MG/ML IJ SUSP
80.0000 mg | Freq: Once | INTRAMUSCULAR | Status: AC
  Administered 2022-04-06: 80 mg via INTRAMUSCULAR

## 2022-04-06 NOTE — Patient Instructions (Addendum)
It was very nice to see you today!    We gave you a steroid injection today to help your sinus pain.  Increase your Flonase nasal spray to 2 squirts twice a day. Can also try over the counter generic Sudafed 1-2 times per day to help with drainage and congestion in sinuses. OK to continue to take Tylenol up to 1,'000mg'$  3 times per day for next few days.  MUST DRINK at least 2 liters of water every day! Stay inside the next few days due to the high heat & humidity.  Call or message via MyChart next week if your symptoms are not resolving.         PLEASE NOTE:  If you had any lab tests please let us know if you have not heard back within a few days. You may see your results on MyChart before we have a chance to review them but we will give you a call once they are reviewed by [place]. If we ordered any referrals today, please let us know if you have not heard from their office within the next week.

## 2022-04-06 NOTE — Progress Notes (Signed)
Patient ID: Nancy Jimenez, female    DOB: 1966-02-18, 56 y.o.   MRN: [id number]  Chief Complaint  Patient presents with   Headache    Pt c/o a sinus headache and nasal drainage (clear) for 4 days, Has been taking Tylenol but  it is not helping. Pt states her sinus headache starts in the front of her head then moves down to under eyes and nasal area. Pt states she wakes up with these headaches. Pt states Imitrex gives her a burning sensation in nasal area so she stopped taking it.     HPI: Sinus headache:   started 4 days ago with sinus drainage and congestion. Has been using Flonase daily, but not last 2 days. Has taken Tylenol with some relief. Reports sinus pain, pressure, frontal headache.     Assessment & Plan:  1. Sinus headache Advised to increase Flonase nasal spray to 2 squirts twice a day, use saline lavage or nasal spray prior to Flonase and during day for disinfection, try OTC generic sudafed 1-2 times per day, ok to continue to take Tylenol up to 1,'000mg'$  tid for next few days. Call the office next week if sx are not resolving.  - methylPREDNISolone acetate (DEPO-MEDROL) injection 80 mg   Subjective:    Outpatient Medications Prior to Visit  Medication Sig Dispense Refill   atorvastatin (LIPITOR) 10 MG tablet Take 10 mg by mouth daily.     atorvastatin (LIPITOR) 10 MG tablet Take 1 tablet (10 mg total) by mouth daily. 90 tablet 3   buPROPion (WELLBUTRIN SR) 200 MG 12 hr tablet Take 200 mg by mouth 2 (two) times daily.     buPROPion (WELLBUTRIN SR) 200 MG 12 hr tablet Take 1 tablet (200 mg total) by mouth daily. 90 tablet 3   dasatinib (SPRYCEL) 100 MG tablet      dasatinib (SPRYCEL) 20 MG tablet Take 1 tablet (20 mg total) by mouth daily. 30 tablet 11   desonide (DESONATE) 0.05 % gel Apply to face and neck 1-2 times daily as needed for itching and irritation. 60 g 3   desonide (DESOWEN) 0.05 % ointment Apply topically 2 times daily. Apply to face and neck 1 to 2 times  daily as needed for itching and irritation. 60 g 2   DULoxetine (CYMBALTA) 30 MG capsule Take 1 capsule (30 mg total) by mouth daily. 60 capsule 0   DULoxetine (CYMBALTA) 30 MG capsule Take 1 capsule (30 mg total) by mouth daily. 90 capsule 3   fluticasone (FLONASE) 50 MCG/ACT nasal spray daily.     hydrocortisone 2.5 % cream Apply to affected areas on face 1-2 times daily as needed 28 g 3   ondansetron (ZOFRAN ODT) 4 MG disintegrating tablet Take 1 tablet (4 mg total) by mouth every 8 (eight) hours as needed for nausea or vomiting. 20 tablet 0   sucralfate (CARAFATE) 1 g tablet Take 1 tablet (1 g total) by mouth 4 times daily for 14 days. (Patient taking differently: as needed.) 56 tablet 0   valACYclovir (VALTREX) 500 MG tablet Take 500 mg by mouth 2 (two) times daily.     valACYclovir (VALTREX) 500 MG tablet Take 1 tablet by mouth daily. 90 tablet 0   valACYclovir (VALTREX) 500 MG tablet Take 1 tablet (500 mg total) by mouth daily. 90 tablet 3   benzonatate (TESSALON) 100 MG capsule Take 1 capsule (100 mg total) by mouth 3 (three) times daily as needed for cough. 15 capsule 0  COVID-19 mRNA Vac-TriS, Pfizer, (PFIZER-BIONT COVID-19 VAC-TRIS) SUSP injection Inject into the muscle. 0.3 mL 0   doxycycline (VIBRAMYCIN) 100 MG capsule Take 1 capsule (100 mg total) by mouth 2 (two) times daily. 20 capsule 0   predniSONE (STERAPRED UNI-PAK 21 TAB) 10 MG (21) TBPK tablet Take tapering dosage over 6 days as directed 21 tablet 0   acetaminophen (TYLENOL) 500 MG tablet Take by mouth.     atorvastatin (LIPITOR) 10 MG tablet TAKE 1 TABLET BY MOUTH ONCE A DAY 90 tablet 3   buPROPion (WELLBUTRIN SR) 200 MG 12 hr tablet TAKE 1 TABLET BY MOUTH ONCE DAILY 90 tablet 3   buPROPion (WELLBUTRIN SR) 200 MG 12 hr tablet TAKE 1 TABLET BY MOUTH ONCE A DAY (Patient not taking: Reported on 05/07/2021) 90 tablet 3   DULoxetine (CYMBALTA) 30 MG capsule TAKE 1 CAPSULE BY MOUTH ONCE A DAY 90 capsule 3   DULoxetine (CYMBALTA)  60 MG capsule TAKE 1 CAPSULE BY MOUTH ONCE DAILY 30 capsule 0   fexofenadine (ALLEGRA ALLERGY) 180 MG tablet Take 1 tablet (180 mg total) by mouth daily for 15 days. (Patient taking differently: Take 180 mg by mouth as needed.) 15 tablet 0   SUMAtriptan (IMITREX) 100 MG tablet Take 1 tablet by mouth as needed for migraine. May repeat dose in 2 hours if needed (at least 2 hours between doses) (Patient not taking: Reported on 04/06/2022) 20 tablet 0   SUMAtriptan (IMITREX) 100 MG tablet Take 1 tablet by mouth 2 times a day at least 2 hours between doses as needed (Patient not taking: Reported on 04/06/2022) 20 tablet 4   No facility-administered medications prior to visit.   Past Medical History:  Diagnosis Date   CML (chronic myelocytic leukemia) (HCC)    High cholesterol    Past Surgical History:  Procedure Laterality Date   ABDOMINAL SURGERY     LAPAROSCOPY     Allergies  Allergen Reactions   Bee Pollen Anaphylaxis, Other (See Comments) and Shortness Of Breath   Codeine Other (See Comments), Itching and Nausea And Vomiting    Other reaction(s): Hallucinations   Other Anaphylaxis   Amoxicillin     Upset stomach, vomiting, itching   Percocet [Oxycodone-Acetaminophen]     Upset stomach, vomiting, itching, hallucinations    Zithromax [Azithromycin]     Stomach upset   Tape Rash    "creates mark on my skin for several weeks"      Objective:    Physical Exam Vitals and nursing note reviewed.  Constitutional:      Appearance: Normal appearance.  HENT:     Right Ear: Tympanic membrane and ear canal normal.     Left Ear: Tympanic membrane and ear canal normal.     Nose:     Right Sinus: Frontal sinus tenderness present.     Left Sinus: Frontal sinus tenderness present.     Mouth/Throat:     Mouth: Mucous membranes are moist.     Pharynx: No pharyngeal swelling, oropharyngeal exudate, posterior oropharyngeal erythema or uvula swelling.  Cardiovascular:     Rate and Rhythm:  Normal rate and regular rhythm.  Pulmonary:     Effort: Pulmonary effort is normal.     Breath sounds: Normal breath sounds.  Musculoskeletal:        General: Normal range of motion.  Lymphadenopathy:     Head:     Right side of head: No preauricular or posterior auricular adenopathy.     Left side  of head: No preauricular or posterior auricular adenopathy.     Cervical: No cervical adenopathy.  Skin:    General: Skin is warm and dry.  Neurological:     Mental Status: She is alert.  Psychiatric:        Mood and Affect: Mood normal.        Behavior: Behavior normal.    BP 129/86 (BP Location: Left Arm, Patient Position: Sitting, Cuff Size: Large)   Pulse 85   Temp 98 F (36.7 C) (Temporal)   Ht '5\' 2"'$  (1.575 m)   Wt 189 lb (85.7 kg)   SpO2 97%   BMI 34.57 kg/m  Wt Readings from Last 3 Encounters:  04/06/22 189 lb (85.7 kg)  05/07/21 190 lb (86.2 kg)  01/16/21 192 lb 0.3 oz (87.1 kg)       Jeanie Sewer, NP

## 2022-04-09 ENCOUNTER — Other Ambulatory Visit (HOSPITAL_COMMUNITY): Payer: Self-pay

## 2022-04-24 DIAGNOSIS — Z7969 Long term (current) use of other immunomodulators and immunosuppressants: Secondary | ICD-10-CM | POA: Diagnosis not present

## 2022-04-24 DIAGNOSIS — R5382 Chronic fatigue, unspecified: Secondary | ICD-10-CM | POA: Diagnosis not present

## 2022-04-24 DIAGNOSIS — C9210 Chronic myeloid leukemia, BCR/ABL-positive, not having achieved remission: Secondary | ICD-10-CM | POA: Diagnosis not present

## 2022-04-27 ENCOUNTER — Other Ambulatory Visit (HOSPITAL_COMMUNITY): Payer: Self-pay

## 2022-04-30 ENCOUNTER — Other Ambulatory Visit (HOSPITAL_COMMUNITY): Payer: Self-pay

## 2022-05-07 ENCOUNTER — Other Ambulatory Visit (HOSPITAL_COMMUNITY): Payer: Self-pay

## 2022-05-08 ENCOUNTER — Other Ambulatory Visit (HOSPITAL_COMMUNITY): Payer: Self-pay

## 2022-05-09 ENCOUNTER — Other Ambulatory Visit (HOSPITAL_COMMUNITY): Payer: Self-pay

## 2022-06-07 ENCOUNTER — Other Ambulatory Visit (HOSPITAL_COMMUNITY): Payer: Self-pay

## 2022-06-11 ENCOUNTER — Other Ambulatory Visit (HOSPITAL_COMMUNITY): Payer: Self-pay

## 2022-06-13 ENCOUNTER — Other Ambulatory Visit (HOSPITAL_COMMUNITY): Payer: Self-pay

## 2022-06-22 ENCOUNTER — Other Ambulatory Visit (HOSPITAL_COMMUNITY): Payer: Self-pay

## 2022-07-04 ENCOUNTER — Other Ambulatory Visit (HOSPITAL_COMMUNITY): Payer: Self-pay

## 2022-07-05 ENCOUNTER — Other Ambulatory Visit (HOSPITAL_COMMUNITY): Payer: Self-pay

## 2022-10-31 IMAGING — DX DG CHEST 1V PORT
1 series · 1 of 1 positions shown · non-contrast
Comparison: None.

CLINICAL DATA: Cough, congestion and headache.

EXAM:
PORTABLE CHEST 1 VIEW

[chest]
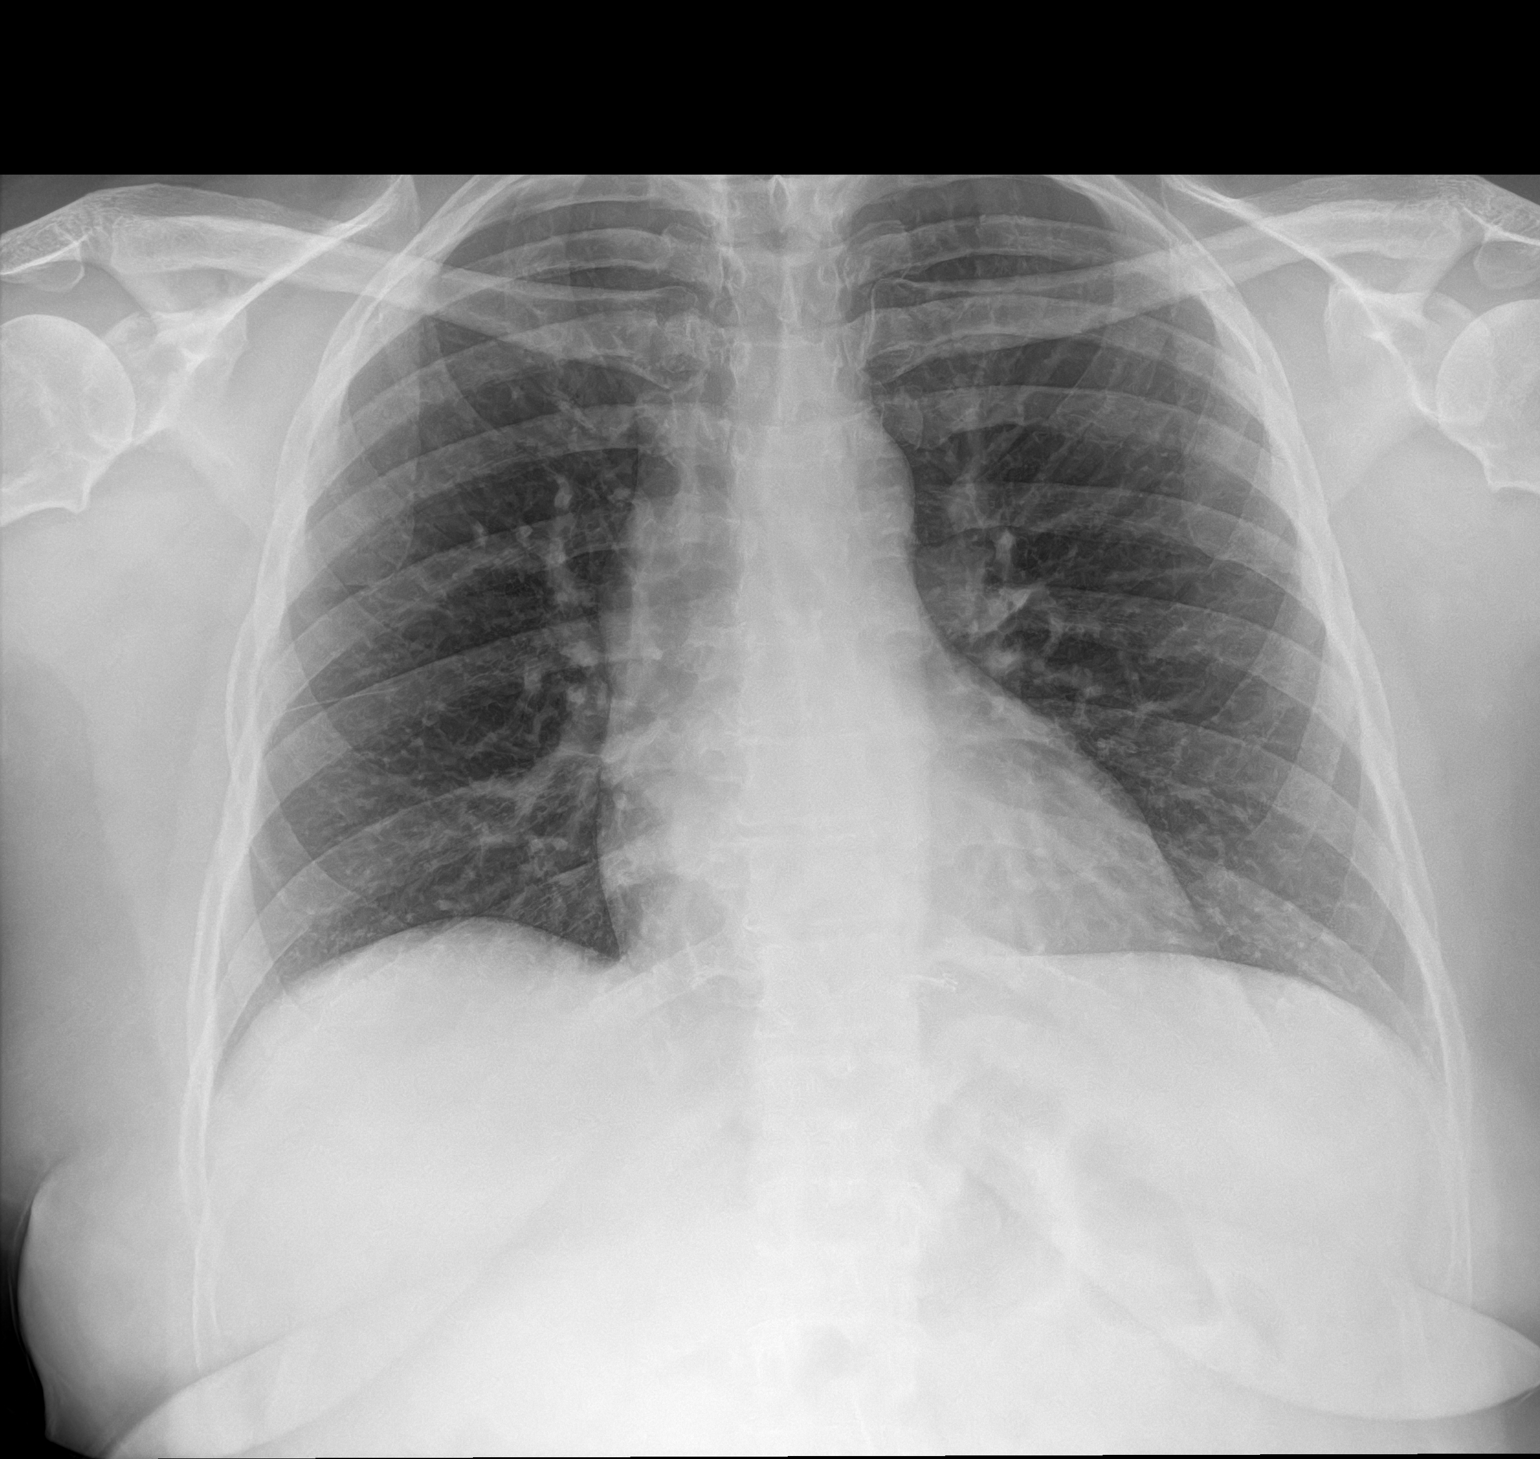

[1 of 1 positions shown; findings below may reference images not displayed]

FINDINGS: The heart size and mediastinal contours are within normal limits.
Both lungs are clear. The visualized skeletal structures are
unremarkable.
IMPRESSION: No active disease.

## 2023-03-27 ENCOUNTER — Other Ambulatory Visit (HOSPITAL_COMMUNITY): Payer: Self-pay

## 2024-10-09 ENCOUNTER — Other Ambulatory Visit: Payer: Self-pay
